# Patient Record
Sex: Female | Born: 1965 | Race: White | Hispanic: No | Marital: Married | Smoking: Former smoker
Health system: Southern US, Community
[De-identification: ages and names within clinical notes are randomized; demographics above are authoritative.]

## PROBLEM LIST (undated history)

## (undated) HISTORY — PX: TUBAL LIGATION: SHX77

---

## 2016-07-20 ENCOUNTER — Emergency Department (HOSPITAL_COMMUNITY): Payer: 59

## 2016-07-20 ENCOUNTER — Inpatient Hospital Stay (HOSPITAL_COMMUNITY)
Admission: EM | Admit: 2016-07-20 | Discharge: 2016-08-05 | DRG: 871 | Disposition: E | Payer: 59 | Attending: Pulmonary Disease | Admitting: Pulmonary Disease

## 2016-07-20 ENCOUNTER — Encounter (HOSPITAL_COMMUNITY): Payer: Self-pay | Admitting: Emergency Medicine

## 2016-07-20 DIAGNOSIS — R160 Hepatomegaly, not elsewhere classified: Secondary | ICD-10-CM | POA: Diagnosis present

## 2016-07-20 DIAGNOSIS — J80 Acute respiratory distress syndrome: Secondary | ICD-10-CM | POA: Diagnosis present

## 2016-07-20 DIAGNOSIS — R6510 Systemic inflammatory response syndrome (SIRS) of non-infectious origin without acute organ dysfunction: Secondary | ICD-10-CM | POA: Diagnosis present

## 2016-07-20 DIAGNOSIS — E875 Hyperkalemia: Secondary | ICD-10-CM | POA: Diagnosis present

## 2016-07-20 DIAGNOSIS — C3401 Malignant neoplasm of right main bronchus: Secondary | ICD-10-CM | POA: Diagnosis not present

## 2016-07-20 DIAGNOSIS — E860 Dehydration: Secondary | ICD-10-CM | POA: Diagnosis present

## 2016-07-20 DIAGNOSIS — Z515 Encounter for palliative care: Secondary | ICD-10-CM | POA: Diagnosis present

## 2016-07-20 DIAGNOSIS — R000 Tachycardia, unspecified: Secondary | ICD-10-CM

## 2016-07-20 DIAGNOSIS — K629 Disease of anus and rectum, unspecified: Secondary | ICD-10-CM | POA: Diagnosis present

## 2016-07-20 DIAGNOSIS — R6520 Severe sepsis without septic shock: Secondary | ICD-10-CM | POA: Diagnosis present

## 2016-07-20 DIAGNOSIS — R0603 Acute respiratory distress: Secondary | ICD-10-CM | POA: Diagnosis present

## 2016-07-20 DIAGNOSIS — E871 Hypo-osmolality and hyponatremia: Secondary | ICD-10-CM | POA: Diagnosis present

## 2016-07-20 DIAGNOSIS — Z801 Family history of malignant neoplasm of trachea, bronchus and lung: Secondary | ICD-10-CM

## 2016-07-20 DIAGNOSIS — J9601 Acute respiratory failure with hypoxia: Secondary | ICD-10-CM | POA: Diagnosis present

## 2016-07-20 DIAGNOSIS — G92 Toxic encephalopathy: Secondary | ICD-10-CM | POA: Diagnosis present

## 2016-07-20 DIAGNOSIS — J189 Pneumonia, unspecified organism: Secondary | ICD-10-CM | POA: Diagnosis present

## 2016-07-20 DIAGNOSIS — C787 Secondary malignant neoplasm of liver and intrahepatic bile duct: Secondary | ICD-10-CM | POA: Diagnosis present

## 2016-07-20 DIAGNOSIS — J440 Chronic obstructive pulmonary disease with acute lower respiratory infection: Secondary | ICD-10-CM | POA: Diagnosis present

## 2016-07-20 DIAGNOSIS — J9690 Respiratory failure, unspecified, unspecified whether with hypoxia or hypercapnia: Secondary | ICD-10-CM

## 2016-07-20 DIAGNOSIS — R590 Localized enlarged lymph nodes: Secondary | ICD-10-CM | POA: Diagnosis present

## 2016-07-20 DIAGNOSIS — E872 Acidosis: Secondary | ICD-10-CM | POA: Diagnosis present

## 2016-07-20 DIAGNOSIS — C3490 Malignant neoplasm of unspecified part of unspecified bronchus or lung: Secondary | ICD-10-CM | POA: Diagnosis not present

## 2016-07-20 DIAGNOSIS — I959 Hypotension, unspecified: Secondary | ICD-10-CM | POA: Diagnosis present

## 2016-07-20 DIAGNOSIS — Z01818 Encounter for other preprocedural examination: Secondary | ICD-10-CM

## 2016-07-20 DIAGNOSIS — C3480 Malignant neoplasm of overlapping sites of unspecified bronchus and lung: Secondary | ICD-10-CM | POA: Diagnosis not present

## 2016-07-20 DIAGNOSIS — F419 Anxiety disorder, unspecified: Secondary | ICD-10-CM | POA: Diagnosis present

## 2016-07-20 DIAGNOSIS — Z23 Encounter for immunization: Secondary | ICD-10-CM | POA: Diagnosis not present

## 2016-07-20 DIAGNOSIS — E876 Hypokalemia: Secondary | ICD-10-CM | POA: Diagnosis present

## 2016-07-20 DIAGNOSIS — Z803 Family history of malignant neoplasm of breast: Secondary | ICD-10-CM

## 2016-07-20 DIAGNOSIS — R0600 Dyspnea, unspecified: Secondary | ICD-10-CM | POA: Diagnosis not present

## 2016-07-20 DIAGNOSIS — F1010 Alcohol abuse, uncomplicated: Secondary | ICD-10-CM | POA: Diagnosis present

## 2016-07-20 DIAGNOSIS — D72825 Bandemia: Secondary | ICD-10-CM

## 2016-07-20 DIAGNOSIS — A419 Sepsis, unspecified organism: Principal | ICD-10-CM | POA: Diagnosis present

## 2016-07-20 DIAGNOSIS — Z87891 Personal history of nicotine dependence: Secondary | ICD-10-CM

## 2016-07-20 DIAGNOSIS — D649 Anemia, unspecified: Secondary | ICD-10-CM | POA: Diagnosis present

## 2016-07-20 DIAGNOSIS — C3400 Malignant neoplasm of unspecified main bronchus: Secondary | ICD-10-CM | POA: Diagnosis not present

## 2016-07-20 DIAGNOSIS — D72829 Elevated white blood cell count, unspecified: Secondary | ICD-10-CM | POA: Diagnosis present

## 2016-07-20 DIAGNOSIS — R579 Shock, unspecified: Secondary | ICD-10-CM | POA: Diagnosis present

## 2016-07-20 DIAGNOSIS — Z0189 Encounter for other specified special examinations: Secondary | ICD-10-CM

## 2016-07-20 LAB — COMPREHENSIVE METABOLIC PANEL
ALT: 19 U/L (ref 14–54)
ANION GAP: 10 (ref 5–15)
AST: 27 U/L (ref 15–41)
Albumin: 3.4 g/dL — ABNORMAL LOW (ref 3.5–5.0)
Alkaline Phosphatase: 73 U/L (ref 38–126)
BILIRUBIN TOTAL: 0.5 mg/dL (ref 0.3–1.2)
CHLORIDE: 101 mmol/L (ref 101–111)
CO2: 20 mmol/L — ABNORMAL LOW (ref 22–32)
Calcium: 9.5 mg/dL (ref 8.9–10.3)
Creatinine, Ser: 0.5 mg/dL (ref 0.44–1.00)
Glucose, Bld: 130 mg/dL — ABNORMAL HIGH (ref 65–99)
POTASSIUM: 3.5 mmol/L (ref 3.5–5.1)
Sodium: 131 mmol/L — ABNORMAL LOW (ref 135–145)
TOTAL PROTEIN: 6.8 g/dL (ref 6.5–8.1)

## 2016-07-20 LAB — CBC WITH DIFFERENTIAL/PLATELET
BASOS ABS: 0.1 10*3/uL (ref 0.0–0.1)
Basophils Relative: 0 %
EOS PCT: 4 %
Eosinophils Absolute: 0.6 10*3/uL (ref 0.0–0.7)
HEMATOCRIT: 40.3 % (ref 36.0–46.0)
Hemoglobin: 14 g/dL (ref 12.0–15.0)
LYMPHS PCT: 16 %
Lymphs Abs: 2.2 10*3/uL (ref 0.7–4.0)
MCH: 31 pg (ref 26.0–34.0)
MCHC: 34.7 g/dL (ref 30.0–36.0)
MCV: 89.2 fL (ref 78.0–100.0)
MONO ABS: 0.9 10*3/uL (ref 0.1–1.0)
MONOS PCT: 7 %
NEUTROS ABS: 9.9 10*3/uL — AB (ref 1.7–7.7)
Neutrophils Relative %: 73 %
PLATELETS: 476 10*3/uL — AB (ref 150–400)
RBC: 4.52 MIL/uL (ref 3.87–5.11)
RDW: 12.3 % (ref 11.5–15.5)
WBC: 13.6 10*3/uL — ABNORMAL HIGH (ref 4.0–10.5)

## 2016-07-20 LAB — TROPONIN I

## 2016-07-20 LAB — PROTIME-INR
INR: 1.04
Prothrombin Time: 13.6 seconds (ref 11.4–15.2)

## 2016-07-20 LAB — I-STAT ARTERIAL BLOOD GAS, ED
Acid-base deficit: 5 mmol/L — ABNORMAL HIGH (ref 0.0–2.0)
Bicarbonate: 17.7 mmol/L — ABNORMAL LOW (ref 20.0–28.0)
O2 SAT: 95 %
PCO2 ART: 25.9 mmHg — AB (ref 32.0–48.0)
PH ART: 7.442 (ref 7.350–7.450)
PO2 ART: 71 mmHg — AB (ref 83.0–108.0)
Patient temperature: 98.5
TCO2: 18 mmol/L (ref 0–100)

## 2016-07-20 LAB — URINALYSIS, ROUTINE W REFLEX MICROSCOPIC
BILIRUBIN URINE: NEGATIVE
Glucose, UA: NEGATIVE mg/dL
Hgb urine dipstick: NEGATIVE
KETONES UR: NEGATIVE mg/dL
LEUKOCYTES UA: NEGATIVE
NITRITE: NEGATIVE
PH: 6 (ref 5.0–8.0)
PROTEIN: NEGATIVE mg/dL
Specific Gravity, Urine: 1.002 — ABNORMAL LOW (ref 1.005–1.030)

## 2016-07-20 LAB — LACTIC ACID, PLASMA
LACTIC ACID, VENOUS: 2 mmol/L — AB (ref 0.5–1.9)
Lactic Acid, Venous: 1.2 mmol/L (ref 0.5–1.9)

## 2016-07-20 LAB — BRAIN NATRIURETIC PEPTIDE: B NATRIURETIC PEPTIDE 5: 33.1 pg/mL (ref 0.0–100.0)

## 2016-07-20 MED ORDER — SODIUM CHLORIDE 0.9 % IV BOLUS (SEPSIS)
1000.0000 mL | Freq: Once | INTRAVENOUS | Status: AC
  Administered 2016-07-20: 1000 mL via INTRAVENOUS

## 2016-07-20 MED ORDER — ONDANSETRON HCL 4 MG PO TABS: 4.0000 mg | ORAL_TABLET | Freq: Four times a day (QID) | ORAL | Status: DC | PRN

## 2016-07-20 MED ORDER — MORPHINE SULFATE (PF) 4 MG/ML IV SOLN
2.0000 mg | INTRAVENOUS | Status: DC | PRN
  Filled 2016-07-20: qty 1

## 2016-07-20 MED ORDER — MORPHINE SULFATE (PF) 2 MG/ML IV SOLN
2.0000 mg | INTRAVENOUS | Status: DC | PRN
  Administered 2016-07-21 (×4): 2 mg via INTRAVENOUS
  Filled 2016-07-20 (×4): qty 1

## 2016-07-20 MED ORDER — DILTIAZEM HCL 25 MG/5ML IV SOLN
10.0000 mg | Freq: Once | INTRAVENOUS | Status: AC
Start: 2016-07-20 — End: 2016-07-20
  Administered 2016-07-20: 10 mg via INTRAVENOUS
  Filled 2016-07-20: qty 5

## 2016-07-20 MED ORDER — ENOXAPARIN SODIUM 40 MG/0.4ML ~~LOC~~ SOLN
40.0000 mg | SUBCUTANEOUS | Status: DC
  Administered 2016-07-21 – 2016-07-22 (×2): 40 mg via SUBCUTANEOUS
  Filled 2016-07-20 (×2): qty 0.4

## 2016-07-20 MED ORDER — SODIUM CHLORIDE 0.9 % IV SOLN
INTRAVENOUS | Status: AC
  Administered 2016-07-20: 23:00:00 via INTRAVENOUS

## 2016-07-20 MED ORDER — ACETAMINOPHEN 650 MG RE SUPP
650.0000 mg | Freq: Four times a day (QID) | RECTAL | Status: DC | PRN
  Administered 2016-07-21 – 2016-07-22 (×3): 650 mg via RECTAL
  Filled 2016-07-20 (×3): qty 1

## 2016-07-20 MED ORDER — IPRATROPIUM-ALBUTEROL 0.5-2.5 (3) MG/3ML IN SOLN
3.0000 mL | RESPIRATORY_TRACT | Status: AC
  Administered 2016-07-20: 3 mL via RESPIRATORY_TRACT
  Filled 2016-07-20: qty 3

## 2016-07-20 MED ORDER — PIPERACILLIN-TAZOBACTAM 3.375 G IVPB 30 MIN
3.3750 g | Freq: Three times a day (TID) | INTRAVENOUS | Status: DC
  Administered 2016-07-20 – 2016-07-25 (×14): 3.375 g via INTRAVENOUS
  Filled 2016-07-20 (×19): qty 50

## 2016-07-20 MED ORDER — LEVALBUTEROL HCL 0.63 MG/3ML IN NEBU: 1.8900 mg | INHALATION_SOLUTION | Freq: Once | RESPIRATORY_TRACT | Status: DC

## 2016-07-20 MED ORDER — FENTANYL CITRATE (PF) 100 MCG/2ML IJ SOLN
50.0000 ug | Freq: Once | INTRAMUSCULAR | Status: AC
  Administered 2016-07-20: 50 ug via INTRAVENOUS
  Filled 2016-07-20: qty 2

## 2016-07-20 MED ORDER — LACTATED RINGERS IV BOLUS (SEPSIS)
1000.0000 mL | Freq: Once | INTRAVENOUS | Status: AC
  Administered 2016-07-20: 1000 mL via INTRAVENOUS

## 2016-07-20 MED ORDER — MORPHINE SULFATE (PF) 4 MG/ML IV SOLN
2.0000 mg | Freq: Once | INTRAVENOUS | Status: AC
  Administered 2016-07-20: 2 mg via INTRAVENOUS

## 2016-07-20 MED ORDER — IPRATROPIUM-ALBUTEROL 0.5-2.5 (3) MG/3ML IN SOLN
3.0000 mL | RESPIRATORY_TRACT | Status: DC
  Administered 2016-07-20 – 2016-07-23 (×16): 3 mL via RESPIRATORY_TRACT
  Filled 2016-07-20 (×16): qty 3

## 2016-07-20 MED ORDER — IPRATROPIUM BROMIDE 0.02 % IN SOLN
1.0000 mg | Freq: Once | RESPIRATORY_TRACT | Status: DC
  Filled 2016-07-20: qty 5

## 2016-07-20 MED ORDER — ONDANSETRON HCL 4 MG/2ML IJ SOLN: 4.0000 mg | Freq: Four times a day (QID) | INTRAMUSCULAR | Status: DC | PRN

## 2016-07-20 MED ORDER — IPRATROPIUM BROMIDE 0.02 % IN SOLN
1.0000 mg | Freq: Once | RESPIRATORY_TRACT | Status: AC
  Administered 2016-07-20: 1 mg via RESPIRATORY_TRACT
  Filled 2016-07-20: qty 5

## 2016-07-20 MED ORDER — IOPAMIDOL (ISOVUE-370) INJECTION 76%
INTRAVENOUS | Status: AC
  Administered 2016-07-20: 100 mL
  Filled 2016-07-20: qty 100

## 2016-07-20 MED ORDER — HYDROCODONE-ACETAMINOPHEN 5-325 MG PO TABS: 1.0000 | ORAL_TABLET | ORAL | Status: DC | PRN

## 2016-07-20 MED ORDER — SODIUM CHLORIDE 0.9 % IV SOLN
500.0000 mg | Freq: Two times a day (BID) | INTRAVENOUS | Status: DC
  Administered 2016-07-20 – 2016-07-21 (×2): 500 mg via INTRAVENOUS
  Filled 2016-07-20 (×3): qty 500

## 2016-07-20 MED ORDER — ACETAMINOPHEN 325 MG PO TABS
650.0000 mg | ORAL_TABLET | Freq: Four times a day (QID) | ORAL | Status: DC | PRN
  Administered 2016-07-25: 650 mg via ORAL
  Filled 2016-07-20: qty 2

## 2016-07-20 MED ORDER — ALBUTEROL (5 MG/ML) CONTINUOUS INHALATION SOLN
15.0000 mg | INHALATION_SOLUTION | RESPIRATORY_TRACT | Status: DC
  Administered 2016-07-20: 15 mg via RESPIRATORY_TRACT
  Filled 2016-07-20: qty 20

## 2016-07-20 MED ORDER — LEVALBUTEROL HCL 0.63 MG/3ML IN NEBU: 0.6300 mg | INHALATION_SOLUTION | RESPIRATORY_TRACT | Status: DC | PRN

## 2016-07-20 MED ORDER — SODIUM CHLORIDE 0.9% FLUSH
3.0000 mL | Freq: Two times a day (BID) | INTRAVENOUS | Status: DC
  Administered 2016-07-20 – 2016-07-25 (×7): 3 mL via INTRAVENOUS

## 2016-07-20 NOTE — ED Notes (Signed)
Charge RN aware of need for room 

## 2016-07-20 NOTE — ED Notes (Signed)
hhn finished  Pt has frequent urination

## 2016-07-20 NOTE — ED Notes (Signed)
Pt would like to establish a physician here in [place]- at [facility].

## 2016-07-20 NOTE — ED Notes (Signed)
Pt was dx with lung ca Tuesday -- seeing a dr in [place], pt has been short of breath for 1-2 weeks, pt unable to speak in complete sentences, O2 sats on room air-- 80's on RA- pt placed on O2 3 L/M/[place]-- sats 90-92%.

## 2016-07-20 NOTE — H&P (Signed)
Felicia Long XTA:[id number] DOB: 1965-08-19 DOA: 07/13/2016     PCP: Elliot Dally, MD  [facility] Specialists: [facility] Patient coming from:    home Lives   With family    Chief Complaint: Shortness of breath and associated chest pain  HPI: Felicia Long is a 51 y.o. female with medical history significant of a diagnosed lung mass and rectal mass with likely metastatic widespread    Presented with worsening dyspnea and shortness of breath and associated chest pain worsen her baseline. She can hardly walk without severe dyspnea which improved with rest. No leg swelling or calf pain.  Patient was found to have new diagnosis of diffuse lung masses as well as masses to her colon and liver few weeks ago. She was seen on 26 over every by her PCP with 2 weeks of cough and worsening shortness of breath she had denied any fevers or chills. D-dimer was elevated CT angiogram was done showing multiple lung masses and she was seen by pulmonology on March 2nd was told that CT scan was worrisome for malignancy due to metastases ordered stat PET scan Patient quit smoking only recently. 15 pound weight loss over past 6 weeks unintentional reports mild hematochezia and constipation no hemoptysis. Denies fevers or chills Denies any prior malignancy history area reports family history of breast cancer never had a mammogram or colonoscopy.  Patient used to drink heavily but all now will have cut down and drinks socially only.  Regarding pertinent Chronic problems: initial CT showed The CTPA showed no evidence of pulmonary emboli but did show multiple scattered bilateral pulmonary nodules many with central cavitation concerning for metastatic disease. In the right medial upper lobe and hilar region was a masslike area of consolidation measuring 3.2 x 7.2 cm. Were also peripherally enhancing masses within the liver the largest in the left hepatic lobe measuring 7 x 9 cm  PET  scan that was done on March 13 showed IMPRESSION: 1.Hypermetabolic right upper lobe masslike soft tissue, most likely primary lung malignancy.  2.Extensive metastatic disease including innumerable pulmonary metastases, hepatic metastases, and adenopathy throughout the chest, abdomen, and pelvis. Small peritoneal implant anterior to the left sacrum is noted.  3.Hypermetabolic rectal soft tissue with perirectal lymph nodes could represent a primary rectal malignancy.  4.Nonspecific mild focal uptake in the left oropharynx. Direct visualization may be beneficial.  Result Narrative  PROCEDURE: F-18 FDG PET/CTscan from the skull base to the mid thighs.  INDICATION: Female, 51 years old.lung mass/multiple nodules/liver lesions Initial treatment strategy.  TECHNIQUE: PET/CTimaging was performed from the skull base to the mid thighs using routine PET acquisition following evaluation of serum glucose level and intravenous administration of F-18 FDG, per standard protocol. A CT scan was performed for  localization and attenuation correction purposes only and is not intended for diagnosis separate from the PET scan.   Radiopharmaceutical: 7.14 mCi of F-18 FDG, intravenously. Blood Glucose level prior to FDG injection: 102 mg/dL. Time from injection to imaging: 61 minutes.  COMPARISON: Chest CTA 07/03/2016  FINDINGS:  Head/Neck:  There is some asymmetric hypermetabolic soft tissue along the left posterior lateral oropharynx (SUV max 4.6, image 38). No hypermetabolic cervical adenopathy.  Chest:  Hypermetabolic masslike soft tissue in the anterior medial right upper lobe with SUV max of 7.5. Innumerable hypermetabolic bilateral pulmonary nodules, many of which are cavitary. Hypermetabolic mediastinal and hilar adenopathy.  Abdomen / Pelvis:  Multiple hypermetabolic liver masses, the largest of which measures 9.1  x 7.5 cm (image 133) and demonstrates some central necrosis. The SUV  max is 8.7 along the periphery. Hypermetabolic retroperitoneal and pelvic lymph nodes. There is a hypermetabolic  1.1 cm nodule between the left sacrum and the psoas muscle (SUV max 3.2, image 196). There is increased activity within the rectum relative to the remainder of the bowel with SUV max of 18. Small hypermetabolic perirectal lymph nodes.  Musculoskeletal:  No aggressive lesions. No abnormal osseous FDG activity.     IN ER:  Temp (24hrs), Avg:98.5 F (36.9 C), Min:98.5 F (36.9 C), Max:98.5 F (36.9 C)      RR 34 pulse 110 BP 124/80  WBC 13.6 Hg 14NA 131 biacrb 20 INR 1.04  CT Angio: no PE prior tumors notes tumor encasement of multiple right upper lobe pulmonary arterial branches.  Following Medications were ordered in ER: Medications  ipratropium-albuterol (DUONEB) 0.5-2.5 (3) MG/3ML nebulizer solution 3 mL (3 mLs Nebulization Given 07/24/2016 1656)  sodium chloride 0.9 % bolus 1,000 mL (1,000 mLs Intravenous New Bag/Given 07/13/2016 1552)  iopamidol (ISOVUE-370) 76 % injection (100 mLs  Contrast Given 07/09/2016 1731)     Hospitalist was called for admission for New proxy in a setting of diffuse pulmonary mass is  Review of Systems:    Pertinent positives include: chest pain, shortness of breath  Constitutional:  No weight loss, night sweats, Fevers, chills, fatigue, weight loss  HEENT:  No headaches, Difficulty swallowing,Tooth/dental problems,Sore throat,  No sneezing, itching, ear ache, nasal congestion, post nasal drip,  Cardio-vascular:  No Orthopnea, PND, anasarca, dizziness, palpitations.no Bilateral lower extremity swelling  GI:  No heartburn, indigestion, abdominal pain, nausea, vomiting, diarrhea, change in bowel habits, loss of appetite, melena, blood in stool, hematemesis Resp:  no shortness of breath at rest. No dyspnea on exertion, No excess mucus, no productive cough, No non-productive cough, No coughing up of blood.No change in color of mucus.No  wheezing. Skin:  no rash or lesions. No jaundice GU:  no dysuria, change in color of urine, no urgency or frequency. No straining to urinate.  No flank pain.  Musculoskeletal:  No joint pain or no joint swelling. No decreased range of motion. No back pain.  Psych:  No change in mood or affect. No depression or anxiety. No memory loss.  Neuro: no localizing neurological complaints, no tingling, no weakness, no double vision, no gait abnormality, no slurred speech, no confusion  As per HPI otherwise 10 point review of systems negative.   Past Medical History: History reviewed. No pertinent past medical history. Past Surgical History:  Procedure Laterality Date  . TUBAL LIGATION       Social History:  Ambulatory   Independently     reports that she quit smoking 11 days ago. Her smoking use included Cigarettes. She has never used smokeless tobacco. She reports that she does not drink alcohol or use drugs.  Allergies:  No Known Allergies     Family History:   Family History  Problem Relation Age of Onset  . Breast cancer Mother   . Lung cancer Other   . CAD Other     Medications: Prior to Admission medications   Medication Sig Start Date End Date Taking? Authorizing Provider  acetaminophen (TYLENOL) 500 MG tablet Take 1,000 mg by mouth every 6 (six) hours as needed for mild pain.   Yes Historical Provider, MD  ibuprofen (ADVIL,MOTRIN) 200 MG tablet Take 800 mg by mouth every 6 (six) hours as needed for mild pain.  Yes Historical Provider, MD  temazepam (RESTORIL) 30 MG capsule Take 30 mg by mouth at bedtime as needed for sleep. 07/11/16  Yes Historical Provider, MD    Physical Exam: Patient Vitals for the past 24 hrs:  BP Temp Temp src Pulse Resp SpO2  07/19/2016 1815 124/80 - - (!) 110 (!) 34 91 %  07/30/2016 1715 115/79 - - (!) 105 (!) 30 90 %  07/09/2016 1700 114/76 - - (!) 104 (!) 30 96 %  07/14/2016 1615 119/78 - - 100 (!) 27 92 %  07/13/2016 1545 103/78 - - (!) 115 (!)  33 91 %  07/27/2016 1530 110/80 - - (!) 124 (!) 27 92 %  07/27/2016 1500 131/87 - - (!) 124 (!) 31 91 %  07/19/2016 1445 113/83 - - (!) 119 (!) 27 (!) 89 %  07/22/2016 1432 - - - - - 91 %  07/19/2016 1415 112/77 - - (!) 119 (!) 27 90 %  07/06/2016 1355 114/78 98.5 F (36.9 C) Axillary (!) 131 (!) 39 (!) 89 %    1. General:  in No Acute distress, increased work of breathing 2. Psychological: Alert and   Oriented 3. Head/ENT:    Dry Mucous Membranes                          Head Non traumatic, neck supple                          Normal   Dentition 4. SKIN:   decreased Skin turgor,  Skin clean Dry and intact no rash 5. Heart: Regular rate and rhythm no  Murmur, Rub or gallop 6. Lungs: some wheezes and crackles   7. Abdomen: Soft,  non-tender, Non distended 8. Lower extremities: no clubbing, cyanosis, or edema 9. Neurologically Grossly intact, moving all 4 extremities equally   10. MSK: Normal range of motion   body mass index is unknown because there is no height or weight on file.  Labs on Admission:   Labs on Admission: I have personally reviewed following labs and imaging studies  CBC:  Recent Labs Lab 07/23/2016 1354  WBC 13.6*  NEUTROABS 9.9*  HGB 14.0  HCT 40.3  MCV 89.2  PLT 185*   Basic Metabolic Panel:  Recent Labs Lab 07/27/2016 1354  NA 131*  K 3.5  CL 101  CO2 20*  GLUCOSE 130*  BUN <5*  CREATININE 0.50  CALCIUM 9.5   GFR: CrCl cannot be calculated (Unknown ideal weight.). Liver Function Tests:  Recent Labs Lab 07/05/2016 1354  AST 27  ALT 19  ALKPHOS 73  BILITOT 0.5  PROT 6.8  ALBUMIN 3.4*   No results for input(s): LIPASE, AMYLASE in the last 168 hours. No results for input(s): AMMONIA in the last 168 hours. Coagulation Profile:  Recent Labs Lab 07/06/2016 1543  INR 1.04   Cardiac Enzymes:  Recent Labs Lab 07/22/2016 1543  TROPONINI <0.03   BNP (last 3 results) No results for input(s): PROBNP in the last 8760 hours. HbA1C: No results for  input(s): HGBA1C in the last 72 hours. CBG: No results for input(s): GLUCAP in the last 168 hours. Lipid Profile: No results for input(s): CHOL, HDL, LDLCALC, TRIG, CHOLHDL, LDLDIRECT in the last 72 hours. Thyroid Function Tests: No results for input(s): TSH, T4TOTAL, FREET4, T3FREE, THYROIDAB in the last 72 hours. Anemia Panel: No results for input(s): VITAMINB12, FOLATE, FERRITIN, TIBC, IRON,  RETICCTPCT in the last 72 hours. Urine analysis:    Component Value Date/Time   COLORURINE STRAW (A) 08/02/2016 1549   APPEARANCEUR CLEAR 07/28/2016 1549   LABSPEC 1.002 (L) 08/01/2016 1549   PHURINE 6.0 07/28/2016 1549   GLUCOSEU NEGATIVE 07/08/2016 1549   HGBUR NEGATIVE 07/19/2016 1549   BILIRUBINUR NEGATIVE 07/19/2016 1549   KETONESUR NEGATIVE 07/21/2016 1549   PROTEINUR NEGATIVE 07/23/2016 1549   NITRITE NEGATIVE 07/14/2016 1549   LEUKOCYTESUR NEGATIVE 07/08/2016 1549   Sepsis Labs: '@LABRCNTIP'$ (procalcitonin:4,lacticidven:4) )No results found for this or any previous visit (from the past 240 hour(s)).    UA  no evidence of UTI     No results found for: HGBA1C  CrCl cannot be calculated (Unknown ideal weight.).  BNP (last 3 results) No results for input(s): PROBNP in the last 8760 hours.   ECG REPORT  Independently reviewed Rate:134  Rhythm: Sinus tachycardia ST&T Change: No acute ischemic changes   QTC 421  There were no vitals filed for this visit.   Cultures: No results found for: SDES, SPECREQUEST, CULT, REPTSTATUS   Radiological Exams on Admission: Dg Chest 2 View  Result Date: 07/21/2016 CLINICAL DATA:  Severe shortness of breath. Nonproductive cough. Recent PET scan that demonstrated probable primary malignancy of the lung as well as pulmonary and hepatic metastases as well as extensive adenopathy in the chest, abdomen, and pelvis. EXAM: CHEST  2 VIEW COMPARISON:  Report of PET-CT scan dated 07/17/2016 FINDINGS: The patient has a 4.3 cm right hilar mass as well as  innumerable metastatic nodules in both lungs. There are no consolidative infiltrates or effusions. The heart size is normal. Bones appear normal. IMPRESSION: Right perihilar lung mass. Innumerable metastatic lesions throughout both lungs. Electronically Signed   By: Lorriane Shire M.D.   On: 07/17/2016 16:37   Ct Angio Chest Pe W And/or Wo Contrast  Result Date: 07/15/2016 CLINICAL DATA:  Shortness of breath with dry cough for 2 months EXAM: CT ANGIOGRAPHY CHEST WITH CONTRAST TECHNIQUE: Multidetector CT imaging of the chest was performed using the standard protocol during bolus administration of intravenous contrast. Multiplanar CT image reconstructions and MIPs were obtained to evaluate the vascular anatomy. CONTRAST:  100 cc Isovue 370 intravenous COMPARISON:  Chest x-ray 07/26/2016 FINDINGS: Cardiovascular: Satisfactory opacification of the pulmonary arteries to the segmental level. No evidence of pulmonary embolism. Encasement and significant narrowing of multiple right upper lobe pulmonary artery branch vessels. Non aneurysmal aorta. No dissection. Normal heart size. No pericardial effusion. Mediastinum/Nodes: Extensive mediastinal adenopathy. Nodal mass anterior to the trachea measures 1.8 x 2 cm. Matted adenopathy within the AP window and sub- carinal space. Subcarinal soft tissue mass measures 2.6 cm. Multiple enlarged bilateral hilar nodes. Suspected right hilar which is contiguous with consolidation an or mass in the anterior right upper lobe, this measures 5.6 cm AP by 6.7 cm oblique axial. Trachea midline. Diffuse peribronchial soft tissue mass an or thickening. Esophagus is nonenlarged. Lungs/Pleura: Innumerable pulmonary masses and nodules. No pleural effusion. Focal consolidative process an or tumor in the right upper lobe contiguous with right hilar soft tissue mass. Upper Abdomen: Multiple ill-defined masses within the liver, the largest is seen within the left hepatic lobe and measures at least  7.1 cm. Imaged adrenal glands, spleen, and upper kidneys are within normal limits. Imaged pancreas normal. Musculoskeletal: No acute or suspicious bone lesion Review of the MIP images confirms the above findings. IMPRESSION: 1. No definite acute pulmonary embolus or aortic dissection 2. Right hilar mass which is  contiguous with consolidation and or mass in the anterior portion of the right upper lobe. There is tumor encasement of multiple right upper lobe pulmonary arterial branches. 3. Extensive mediastinal and hilar adenopathy, consistent with metastatic disease. Diffuse bilateral peribronchial soft tissue thickening. 4. Innumerable pulmonary nodules and masses, consistent with metastatic disease. 5. Multiple ill-defined masses within the liver, consistent with hepatic metastatic disease. Electronically Signed   By: Donavan Foil M.D.   On: 07/16/2016 18:16    Chart has been reviewed    Assessment/Plan   51 y.o. female with medical history significant of a diagnosed lung mass and rectal mass with likely metastatic widespread   Present on Admission: . Lung cancer ([place]) - discussed with oncology who seen in consult overall suspect poor prognosis. Obtain CA 125 CA 19, CEA, oncology will discuss possible need for biopsy vs comfort measures, Will likely need further staging with MRI if respiratory status improves . Acute respiratory failure with hypoxia ([facility]) most likely secondary to severe lung disease will admit to step down attempt to use nebulizer obtain ABG gas 7.441/26/71 Discussed with [facility] appreciate their consult for increased work of breathing . Leukocytosis possibly stressed margination afebrile. SIRS  - tachycardia to keep make hypoxic possibility of postobstructive pneumonia coverage with Zosyn and obtain serial lactic acid     Other plan as per orders.  DVT prophylaxis:  Lovenox   Code Status:  FULL CODE  as per patient    Family Communication:   Family  at  Bedside  plan of  care was discussed with Husband,   Disposition Plan:  unfortunately very poor prognosis will benefit from palliative care goals of care discussion                        Would benefit from PT/OT eval prior to DC   Order once stable                        Palliative care   consulted                          Consults called: notify oncology   Admission status:    inpatient     Level of care     SDU      I have spent a total of 77 min on this admission  extra time was spent to discuss case with PCCM and oncology   [place] 07/17/2016, 10:34 PM    [facility]  Pager [phone]   after 2 AM please page floor coverage PA If 7AM-7PM, please contact the day team taking care of the patient  [email or web address]  Password [email or web address]

## 2016-07-20 NOTE — ED Triage Notes (Signed)
Pt in c/o SOB, pt had PET scan with recent Lung Cancer diagnosis, pt has fast HR, tachypnea, & sats in the upper 80s in triage, pt speaks in short sentences, using accessory  Muscles, A&O x4

## 2016-07-20 NOTE — ED Notes (Signed)
Pt still c/o lower back pain

## 2016-07-20 NOTE — ED Notes (Signed)
To x-ray

## 2016-07-20 NOTE — ED Notes (Signed)
Report called to rn on 4e 

## 2016-07-20 NOTE — Progress Notes (Signed)
Pt is on NIV at this time tolerating it well. Settings are in the flowsheet. Family at bedside

## 2016-07-20 NOTE — ED Notes (Signed)
The pt returned from c-t  Still having pain in her back asking for water given.  edp has talked with her and is admitting her  Iv fluid approx half infused

## 2016-07-20 NOTE — ED Notes (Signed)
Pt c/o lower back pain and a little lower abd pain

## 2016-07-20 NOTE — ED Provider Notes (Signed)
[facility] DEPT Provider Note   CSN: [id number] Arrival date & time: 07/19/2016  1346     History   Chief Complaint Chief Complaint  Patient presents with  . Chest Pain    HPI Felicia Long is a 51 y.o. female.  HPI  This is a 51 year old female with a long smoking history and alcohol use history but not addicted who recently was diagnosed with likely metastatic lung cancer possibly a primary lesion in the rectum as well. She was seen by no vomiting for these things and had a PET scan done a couple days ago which showed multiple lesions in liver long rectum and soft tissue. The last few weeks she's had progressively worsening dyspnea worse with exertion. She states her last with agents even gotten worse. She states that she can't walk very far without incident for short of breath when she stops he gets better. She doesn't really have any other associated symptoms. She has not had fever, cough, chest pain, calf pain, leg swelling. She has had recent weight loss but since the cancer diagnoses increased her intake. She still does so she slightly dehydrated with dry mouth but has been urinating relatively clear. Nitrite negative for the symptoms. Nothing makes it better such rest and even then she feels short of breath and like she is breathing fast.  History reviewed. No pertinent past medical history.  Patient Active Problem List   Diagnosis Date Noted  . Lung cancer ([place]) 08/02/2016  . Acute respiratory failure with hypoxia ([place]) 07/29/2016  . Leukocytosis 07/14/2016  . Respiratory distress 07/12/2016    Past Surgical History:  Procedure Laterality Date  . TUBAL LIGATION      OB History    No data available       Home Medications    Prior to Admission medications   Medication Sig Start Date End Date Taking? Authorizing Provider  acetaminophen (TYLENOL) 500 MG tablet Take 1,000 mg by mouth every 6 (six) hours as needed for mild pain.   Yes Historical Provider, MD    ibuprofen (ADVIL,MOTRIN) 200 MG tablet Take 800 mg by mouth every 6 (six) hours as needed for mild pain.   Yes Historical Provider, MD  temazepam (RESTORIL) 30 MG capsule Take 30 mg by mouth at bedtime as needed for sleep. 07/11/16  Yes Historical Provider, MD    Family History Family History  Problem Relation Age of Onset  . Breast cancer Mother   . Lung cancer Other   . CAD Other     Social History Social History  Substance Use Topics  . Smoking status: Former Smoker    Types: Cigarettes    Quit date: 07/09/2016  . Smokeless tobacco: Never Used  . Alcohol use No     Comment: quit 1 month     Allergies   Patient has no known allergies.   Review of Systems Review of Systems  All other systems reviewed and are negative.    Physical Exam Updated Vital Signs BP 130/90   Pulse 94   Temp 98.5 F (36.9 C) (Axillary)   Resp (!) 37   LMP 07/06/2016 (Approximate)   SpO2 (!) 89%   Physical Exam  Constitutional: She is oriented to person, place, and time. She appears well-developed and well-nourished.  HENT:  Head: Normocephalic and atraumatic.  Eyes: Conjunctivae and EOM are normal.  Neck: Normal range of motion.  Cardiovascular: Regular rhythm.  Tachycardia present.   Pulmonary/Chest: No stridor. Tachypnea noted. She is in respiratory  distress (mild). She has decreased breath sounds. She has wheezes. She has rales.  rquiring 4L to maintain sats  Abdominal: Soft. Bowel sounds are normal. She exhibits no distension. There is no tenderness. There is no guarding.  Neurological: She is alert and oriented to person, place, and time.  Skin: Skin is warm and dry.  Nursing note and vitals reviewed.    ED Treatments / Results  Labs (all labs ordered are listed, but only abnormal results are displayed) Labs Reviewed  CBC WITH DIFFERENTIAL/PLATELET - Abnormal; Notable for the following:       Result Value   WBC 13.6 (*)    Platelets 476 (*)    Neutro Abs 9.9 (*)    All  other components within normal limits  COMPREHENSIVE METABOLIC PANEL - Abnormal; Notable for the following:    Sodium 131 (*)    CO2 20 (*)    Glucose, Bld 130 (*)    BUN <5 (*)    Albumin 3.4 (*)    All other components within normal limits  URINALYSIS, ROUTINE W REFLEX MICROSCOPIC - Abnormal; Notable for the following:    Color, Urine STRAW (*)    Specific Gravity, Urine 1.002 (*)    All other components within normal limits  TROPONIN I  BRAIN NATRIURETIC PEPTIDE  LACTIC ACID, PLASMA  PROTIME-INR  LACTIC ACID, PLASMA    EKG  EKG Interpretation  Date/Time:  Friday July 20 2016 13:51:39 EDT Ventricular Rate:  134 PR Interval:  142 QRS Duration: 62 QT Interval:  282 QTC Calculation: 421 R Axis:   114 Text Interpretation:  Sinus tachycardia Right atrial enlargement Right axis deviation Anteroseptal infarct , age undetermined Abnormal ECG Confirmed by [facility] MD, Braian Tijerina [phone]) on 07/17/2016 3:07:46 PM       Radiology Dg Chest 2 View  Result Date: 07/17/2016 CLINICAL DATA:  Severe shortness of breath. Nonproductive cough. Recent PET scan that demonstrated probable primary malignancy of the lung as well as pulmonary and hepatic metastases as well as extensive adenopathy in the chest, abdomen, and pelvis. EXAM: CHEST  2 VIEW COMPARISON:  Report of PET-CT scan dated 07/17/2016 FINDINGS: The patient has a 4.3 cm right hilar mass as well as innumerable metastatic nodules in both lungs. There are no consolidative infiltrates or effusions. The heart size is normal. Bones appear normal. IMPRESSION: Right perihilar lung mass. Innumerable metastatic lesions throughout both lungs. Electronically Signed   By: Lorriane Shire M.D.   On: 08/01/2016 16:37   Ct Angio Chest Pe W And/or Wo Contrast  Result Date: 07/08/2016 CLINICAL DATA:  Shortness of breath with dry cough for 2 months EXAM: CT ANGIOGRAPHY CHEST WITH CONTRAST TECHNIQUE: Multidetector CT imaging of the chest was performed using the  standard protocol during bolus administration of intravenous contrast. Multiplanar CT image reconstructions and MIPs were obtained to evaluate the vascular anatomy. CONTRAST:  100 cc Isovue 370 intravenous COMPARISON:  Chest x-ray 07/18/2016 FINDINGS: Cardiovascular: Satisfactory opacification of the pulmonary arteries to the segmental level. No evidence of pulmonary embolism. Encasement and significant narrowing of multiple right upper lobe pulmonary artery branch vessels. Non aneurysmal aorta. No dissection. Normal heart size. No pericardial effusion. Mediastinum/Nodes: Extensive mediastinal adenopathy. Nodal mass anterior to the trachea measures 1.8 x 2 cm. Matted adenopathy within the AP window and sub- carinal space. Subcarinal soft tissue mass measures 2.6 cm. Multiple enlarged bilateral hilar nodes. Suspected right hilar which is contiguous with consolidation an or mass in the anterior right upper lobe, this measures  5.6 cm AP by 6.7 cm oblique axial. Trachea midline. Diffuse peribronchial soft tissue mass an or thickening. Esophagus is nonenlarged. Lungs/Pleura: Innumerable pulmonary masses and nodules. No pleural effusion. Focal consolidative process an or tumor in the right upper lobe contiguous with right hilar soft tissue mass. Upper Abdomen: Multiple ill-defined masses within the liver, the largest is seen within the left hepatic lobe and measures at least 7.1 cm. Imaged adrenal glands, spleen, and upper kidneys are within normal limits. Imaged pancreas normal. Musculoskeletal: No acute or suspicious bone lesion Review of the MIP images confirms the above findings. IMPRESSION: 1. No definite acute pulmonary embolus or aortic dissection 2. Right hilar mass which is contiguous with consolidation and or mass in the anterior portion of the right upper lobe. There is tumor encasement of multiple right upper lobe pulmonary arterial branches. 3. Extensive mediastinal and hilar adenopathy, consistent with  metastatic disease. Diffuse bilateral peribronchial soft tissue thickening. 4. Innumerable pulmonary nodules and masses, consistent with metastatic disease. 5. Multiple ill-defined masses within the liver, consistent with hepatic metastatic disease. Electronically Signed   By: Donavan Foil M.D.   On: 07/16/2016 18:16    Procedures Procedures (including critical care time)  CRITICAL CARE Performed by: Merrily Pew Total critical care time: 45 minutes Critical care time was exclusive of separately billable procedures and treating other patients. Critical care was necessary to treat or prevent imminent or life-threatening deterioration. Critical care was time spent personally by me on the following activities: development of treatment plan with patient and/or surrogate as well as nursing, discussions with consultants, evaluation of patient's response to treatment, examination of patient, obtaining history from patient or surrogate, ordering and performing treatments and interventions, ordering and review of laboratory studies, ordering and review of radiographic studies, pulse oximetry and re-evaluation of patient's condition.  Medications Ordered in ED Medications  ipratropium-albuterol (DUONEB) 0.5-2.5 (3) MG/3ML nebulizer solution 3 mL (3 mLs Nebulization Given 08/01/2016 1656)  ipratropium-albuterol (DUONEB) 0.5-2.5 (3) MG/3ML nebulizer solution 3 mL (3 mLs Nebulization Given 07/29/2016 1959)  levalbuterol (XOPENEX) nebulizer solution 0.63 mg (not administered)  morphine 4 MG/ML injection 2 mg (not administered)  sodium chloride 0.9 % bolus 1,000 mL (0 mLs Intravenous Stopped 07/06/2016 1948)  iopamidol (ISOVUE-370) 76 % injection (100 mLs  Contrast Given 07/12/2016 1731)  sodium chloride 0.9 % bolus 1,000 mL (1,000 mLs Intravenous New Bag/Given 07/09/2016 2000)  fentaNYL (SUBLIMAZE) injection 50 mcg (50 mcg Intravenous Given 07/07/2016 2015)     Initial Impression / Assessment and Plan / ED Course  I  have reviewed the triage vital signs and the nursing notes.  Pertinent labs & imaging results that were available during my care of the patient were reviewed by me and considered in my medical decision making (see chart for details).     On exam, requiring 4L to maintain sats > 90, tachypnea, tachycardia. Suspect pleural effusion, however is high risk for PE as well.  Less likely asthma/copd/emphysema, but will give breathing tx to see if it helps.  Breathing treatments seem to help. Chest x-ray showed diffuse metastatic disease. CT scan did not show PE but just revealed the same of multiple areas of masses and inflammation. HR improved with fluids consistent with dehydration. Palliative care consult orderedis secondary to the extent of her disease and her new respiratory failure and a likely poor prognosis of this. Discussed the case with medicine who will admit for further workup and management.  Final Clinical Impressions(s) / ED Diagnoses   Final  diagnoses:  Acute respiratory failure with hypoxia ([place])  Tachycardia  Dehydration      Merrily Pew, MD 07/18/2016 2049

## 2016-07-20 NOTE — ED Notes (Signed)
To ct

## 2016-07-20 NOTE — Progress Notes (Signed)
Pharmacy Antibiotic Note  Felicia Long is a 51 y.o. female admitted on 07/28/2016 with sepsis.  Pharmacy has been consulted for Vancomycin dosing. Recent diagnosis of metastatic lung CA. WBC mildly elevated, renal function good.   Plan: Vancomycin 500 mg IV q12h Zosyn 3.375G IV q8h to be infused over 4 hours Trend WBC, temp, renal function  F/U infectious work-up Drug levels as indicated  Temp (24hrs), Avg:98.5 F (36.9 C), Min:98.5 F (36.9 C), Max:98.5 F (36.9 C)   Recent Labs Lab 07/07/2016 1354 07/16/2016 1543 07/17/2016 1817  WBC 13.6*  --   --   CREATININE 0.50  --   --   LATICACIDVEN  --  1.2 2.0*    CrCl cannot be calculated (Unknown ideal weight.).    No Known Allergies  Narda Bonds 07/08/2016 10:43 PM

## 2016-07-20 NOTE — Progress Notes (Signed)
ABG collected  

## 2016-07-21 DIAGNOSIS — Z515 Encounter for palliative care: Secondary | ICD-10-CM

## 2016-07-21 DIAGNOSIS — C3490 Malignant neoplasm of unspecified part of unspecified bronchus or lung: Secondary | ICD-10-CM

## 2016-07-21 DIAGNOSIS — R0600 Dyspnea, unspecified: Secondary | ICD-10-CM

## 2016-07-21 DIAGNOSIS — C787 Secondary malignant neoplasm of liver and intrahepatic bile duct: Secondary | ICD-10-CM

## 2016-07-21 LAB — PROTIME-INR
INR: 1.14
Prothrombin Time: 14.7 seconds (ref 11.4–15.2)

## 2016-07-21 LAB — BASIC METABOLIC PANEL
Anion gap: 7 (ref 5–15)
BUN: 5 mg/dL — AB (ref 6–20)
CO2: 23 mmol/L (ref 22–32)
CREATININE: 0.46 mg/dL (ref 0.44–1.00)
Calcium: 8.3 mg/dL — ABNORMAL LOW (ref 8.9–10.3)
Chloride: 109 mmol/L (ref 101–111)
GFR calc Af Amer: >60 mL/min (ref >60)
GFR calc non Af Amer: >60 mL/min (ref >60)
Glucose, Bld: 104 mg/dL — ABNORMAL HIGH (ref 65–99)
Potassium: 4.6 mmol/L (ref 3.5–5.1)
Sodium: 139 mmol/L (ref 135–145)

## 2016-07-21 LAB — COMPREHENSIVE METABOLIC PANEL
ALT: 17 U/L (ref 14–54)
ANION GAP: 13 (ref 5–15)
AST: 28 U/L (ref 15–41)
Albumin: 2.7 g/dL — ABNORMAL LOW (ref 3.5–5.0)
Alkaline Phosphatase: 62 U/L (ref 38–126)
BUN: <5 mg/dL — ABNORMAL LOW (ref 6–20)
CHLORIDE: 105 mmol/L (ref 101–111)
CO2: 19 mmol/L — ABNORMAL LOW (ref 22–32)
CREATININE: 0.73 mg/dL (ref 0.44–1.00)
Calcium: 8.5 mg/dL — ABNORMAL LOW (ref 8.9–10.3)
GFR calc non Af Amer: >60 mL/min (ref >60)
Glucose, Bld: 159 mg/dL — ABNORMAL HIGH (ref 65–99)
Potassium: 2.8 mmol/L — ABNORMAL LOW (ref 3.5–5.1)
Sodium: 137 mmol/L (ref 135–145)
Total Bilirubin: 0.7 mg/dL (ref 0.3–1.2)
Total Protein: 5.6 g/dL — ABNORMAL LOW (ref 6.5–8.1)

## 2016-07-21 LAB — CBC
HCT: 38.2 % (ref 36.0–46.0)
HEMOGLOBIN: 12.4 g/dL (ref 12.0–15.0)
MCH: 29.7 pg (ref 26.0–34.0)
MCHC: 32.5 g/dL (ref 30.0–36.0)
MCV: 91.6 fL (ref 78.0–100.0)
Platelets: 344 10*3/uL (ref 150–400)
RBC: 4.17 MIL/uL (ref 3.87–5.11)
RDW: 12.6 % (ref 11.5–15.5)
WBC: 18.4 10*3/uL — ABNORMAL HIGH (ref 4.0–10.5)

## 2016-07-21 LAB — HIV ANTIBODY (ROUTINE TESTING W REFLEX): HIV Screen 4th Generation wRfx: NONREACTIVE

## 2016-07-21 LAB — LACTIC ACID, PLASMA
Lactic Acid, Venous: 3 mmol/L (ref 0.5–1.9)
Lactic Acid, Venous: 6.2 mmol/L (ref 0.5–1.9)

## 2016-07-21 LAB — MAGNESIUM
Magnesium: 1.5 mg/dL — ABNORMAL LOW (ref 1.7–2.4)
Magnesium: 2.2 mg/dL (ref 1.7–2.4)

## 2016-07-21 LAB — SODIUM, URINE, RANDOM

## 2016-07-21 LAB — PROCALCITONIN: Procalcitonin: 0.19 ng/mL

## 2016-07-21 LAB — MRSA PCR SCREENING: MRSA BY PCR: NEGATIVE

## 2016-07-21 LAB — CHLORIDE, URINE, RANDOM: Chloride Urine: <15 mmol/L

## 2016-07-21 LAB — TSH: TSH: 3.319 u[IU]/mL (ref 0.350–4.500)

## 2016-07-21 LAB — APTT: APTT: 31 s (ref 24–36)

## 2016-07-21 LAB — PHOSPHORUS: Phosphorus: 2.8 mg/dL (ref 2.5–4.6)

## 2016-07-21 MED ORDER — PNEUMOCOCCAL VAC POLYVALENT 25 MCG/0.5ML IJ INJ
0.5000 mL | INJECTION | INTRAMUSCULAR | Status: DC
  Filled 2016-07-21: qty 0.5

## 2016-07-21 MED ORDER — DEXAMETHASONE SODIUM PHOSPHATE 10 MG/ML IJ SOLN
10.0000 mg | INTRAMUSCULAR | Status: DC
  Administered 2016-07-21 – 2016-07-24 (×3): 10 mg via INTRAVENOUS
  Filled 2016-07-21 (×5): qty 1

## 2016-07-21 MED ORDER — MIRTAZAPINE 15 MG PO TABS
15.0000 mg | ORAL_TABLET | Freq: Every day | ORAL | Status: DC
  Administered 2016-07-22 – 2016-07-24 (×3): 15 mg via ORAL
  Filled 2016-07-21 (×4): qty 1

## 2016-07-21 MED ORDER — INFLUENZA VAC SPLIT QUAD 0.5 ML IM SUSY
0.5000 mL | PREFILLED_SYRINGE | INTRAMUSCULAR | Status: DC
  Filled 2016-07-21: qty 0.5

## 2016-07-21 MED ORDER — SODIUM CHLORIDE 0.9 % IV BOLUS (SEPSIS)
1000.0000 mL | Freq: Once | INTRAVENOUS | Status: AC
  Administered 2016-07-21: 1000 mL via INTRAVENOUS

## 2016-07-21 MED ORDER — DILTIAZEM HCL 25 MG/5ML IV SOLN
10.0000 mg | INTRAVENOUS | Status: AC
  Administered 2016-07-21: 10 mg via INTRAVENOUS
  Filled 2016-07-21: qty 5

## 2016-07-21 MED ORDER — METOPROLOL TARTRATE 5 MG/5ML IV SOLN
5.0000 mg | Freq: Four times a day (QID) | INTRAVENOUS | Status: DC
  Administered 2016-07-21 – 2016-07-24 (×11): 5 mg via INTRAVENOUS
  Filled 2016-07-21 (×14): qty 5

## 2016-07-21 MED ORDER — WHITE PETROLATUM GEL
Status: AC
  Administered 2016-07-22: 06:00:00
  Filled 2016-07-21: qty 1

## 2016-07-21 MED ORDER — MAGNESIUM SULFATE 2 GM/50ML IV SOLN
2.0000 g | Freq: Once | INTRAVENOUS | Status: AC
  Administered 2016-07-21: 2 g via INTRAVENOUS
  Filled 2016-07-21: qty 50

## 2016-07-21 MED ORDER — MORPHINE SULFATE (PF) 2 MG/ML IV SOLN
2.0000 mg | INTRAVENOUS | Status: DC | PRN
  Administered 2016-07-21 – 2016-07-22 (×11): 2 mg via INTRAVENOUS
  Filled 2016-07-21 (×11): qty 1

## 2016-07-21 MED ORDER — ACETAMINOPHEN 325 MG PO TABS
650.0000 mg | ORAL_TABLET | Freq: Three times a day (TID) | ORAL | Status: DC
  Administered 2016-07-22 – 2016-07-25 (×8): 650 mg via ORAL
  Filled 2016-07-21 (×9): qty 2

## 2016-07-21 MED ORDER — SODIUM CHLORIDE 0.9 % IV SOLN: INTRAVENOUS | Status: AC

## 2016-07-21 MED ORDER — ALPRAZOLAM 0.25 MG PO TABS: 0.2500 mg | ORAL_TABLET | Freq: Three times a day (TID) | ORAL | Status: DC | PRN

## 2016-07-21 MED ORDER — SODIUM CHLORIDE 0.9 % IV SOLN
30.0000 meq | INTRAVENOUS | Status: AC
  Administered 2016-07-21 (×2): 30 meq via INTRAVENOUS
  Filled 2016-07-21 (×2): qty 15

## 2016-07-21 NOTE — Progress Notes (Signed)
CRITICAL VALUE ALERT  Critical value received:  Lactic acid 6.2  Date of notification:  07/21/16  Time of notification:  0323  Critical value read back: YES  Nurse who received alert:  Johnnye Sima, RN, BSN  MD notified (1st page):  Donnal Debar, NP  Time of first page:  0325  MD notified (2nd page):  Time of second page:  Responding MD:    Time MD responded:

## 2016-07-21 NOTE — Progress Notes (Signed)
Attempted to place patient on 10L high flow nasal cannula without success. Patient dropped to 78% after less than 3 minutes on 10L high flow, RT placed BiPAP back on and administered breathing treatment. RT will continue to monitor.

## 2016-07-21 NOTE — Consult Note (Signed)
PULMONARY / CRITICAL CARE MEDICINE   Name: Felicia Long MRN: [id number] DOB: 25-Jun-1965    ADMISSION DATE:  07/11/2016 CONSULTATION DATE:  07/26/2016   REFERRING MD:  Dr. Roel Cluck  CHIEF COMPLAINT:  Shortness of breath and increased work of breathing  HISTORY OF PRESENT ILLNESS:   Ms. Felicia Long is a 51 y/o long time smoker (quit about 2 weeks ago) who was recently evaluated at [facility] for lung nodules found on a CT scan obtained for shortness of breath.  She has undergone a PET CT which shows multiple FDG avid bilateral pulmonary nodules with a large right sided hilar mass.  She was also found to have multiple liver lesions and FDG avid abdominal and thoracic lymph nodes. She [facility] with worsening shortness of breath and was found to have O2 saturations in the mid 80s on RA.  A CTA was done to rule out PE which confirmed her multiple pulmonary nodules but did not show PE.  She was wheezing on admission and was given multiple albuterol treatments and 1 hour of continuous albuterol.  Upon admission her O2 saturations improved on 3L [place], however when she arrived on 4E she was noted to have significantly increased WOB, tachypnea and tachycardia.  She was placed on BiPap with improvement in her O2 sats as well as her WOB.  Her ABG on 3L  was 7.44/26/71 with a serum CO2 of 20.  Pulmonary was consulted due to her shortness of breath and possible need for a higher level of care.   PAST MEDICAL HISTORY :  Multiple pulmonary nodules found in the last month  PAST SURGICAL HISTORY: She  has a past surgical history that includes Tubal ligation.  No Known Allergies  No current facility-administered medications on file prior to encounter.    No current outpatient prescriptions on file prior to encounter.    FAMILY HISTORY:  Her indicated that the status of her mother is unknown. She indicated that the status of her other is unknown.    SOCIAL HISTORY: She  reports that she quit smoking 12 days ago.  Her smoking use included Cigarettes. She has never used smokeless tobacco. She reports that she does not drink alcohol or use drugs.  REVIEW OF SYSTEMS:   She has had weight loss over the last month, she denies fever, chills or productive cough.  She has had a dry cough for the last month.   SUBJECTIVE:  51 y/o woman with right sided hilar mass and inumerable pulmonary nodules consistent with metastatic disease.    VITAL SIGNS: BP 122/81 (BP Location: Right Arm)   Pulse (!) 158   Temp 98.2 F (36.8 C) (Oral)   Resp (!) 36   LMP 07/06/2016 (Approximate)   SpO2 97%   HEMODYNAMICS:    VENTILATOR SETTINGS: FiO2 (%):  [40 %-60 %] 60 %  INTAKE / OUTPUT: I/O last 3 completed shifts: In: 500 [I.V.:500] Out: 1500 [Urine:1500]  PHYSICAL EXAMINATION: General:  Laying in bed, BiPap in place, answering questions and giving thumbs up Neuro:  Alert and oriented x4 HEENT:  PERRL, EOMI, Bipap mask in place Cardiovascular:  Tachycardic, regular rhythm, no MRG Lungs:  Expiratory wheezes in bilateral upper lobes, relatively clear otherwise Abdomen:  Soft, non-tender, no palpable HSM, +BS Musculoskeletal:  No joint abnormalities, wearing acrylic nails, ?clubbing of toes, slightly prolonged capillary refill Skin:  No rashes or other lesions  LABS:  BMET  Recent Labs Lab 07/17/2016 1354  NA 131*  K 3.5  CL 101  CO2 20*  BUN <5*  CREATININE 0.50  GLUCOSE 130*    Electrolytes  Recent Labs Lab 07/24/2016 1354  CALCIUM 9.5    CBC  Recent Labs Lab 07/16/2016 1354  WBC 13.6*  HGB 14.0  HCT 40.3  PLT 476*    Coag's  Recent Labs Lab 07/14/2016 1543  INR 1.04    Sepsis Markers  Recent Labs Lab 07/23/2016 1543 08/02/2016 1817 07/11/2016 2305  LATICACIDVEN 1.2 2.0* 3.0*  PROCALCITON  --   --  0.19    ABG  Recent Labs Lab 07/18/2016 2202  PHART 7.442  PCO2ART 25.9*  PO2ART 71.0*    Liver Enzymes  Recent Labs Lab 07/16/2016 1354  AST 27  ALT 19  ALKPHOS 73   BILITOT 0.5  ALBUMIN 3.4*    Cardiac Enzymes  Recent Labs Lab 07/10/2016 1543  TROPONINI <0.03    Glucose No results for input(s): GLUCAP in the last 168 hours.  Imaging Dg Chest 2 View  Result Date: 07/18/2016 CLINICAL DATA:  Severe shortness of breath. Nonproductive cough. Recent PET scan that demonstrated probable primary malignancy of the lung as well as pulmonary and hepatic metastases as well as extensive adenopathy in the chest, abdomen, and pelvis. EXAM: CHEST  2 VIEW COMPARISON:  Report of PET-CT scan dated 07/17/2016 FINDINGS: The patient has a 4.3 cm right hilar mass as well as innumerable metastatic nodules in both lungs. There are no consolidative infiltrates or effusions. The heart size is normal. Bones appear normal. IMPRESSION: Right perihilar lung mass. Innumerable metastatic lesions throughout both lungs. Electronically Signed   By: Lorriane Shire M.D.   On: 07/27/2016 16:37   Ct Angio Chest Pe W And/or Wo Contrast  Result Date: 07/17/2016 CLINICAL DATA:  Shortness of breath with dry cough for 2 months EXAM: CT ANGIOGRAPHY CHEST WITH CONTRAST TECHNIQUE: Multidetector CT imaging of the chest was performed using the standard protocol during bolus administration of intravenous contrast. Multiplanar CT image reconstructions and MIPs were obtained to evaluate the vascular anatomy. CONTRAST:  100 cc Isovue 370 intravenous COMPARISON:  Chest x-ray 07/11/2016 FINDINGS: Cardiovascular: Satisfactory opacification of the pulmonary arteries to the segmental level. No evidence of pulmonary embolism. Encasement and significant narrowing of multiple right upper lobe pulmonary artery branch vessels. Non aneurysmal aorta. No dissection. Normal heart size. No pericardial effusion. Mediastinum/Nodes: Extensive mediastinal adenopathy. Nodal mass anterior to the trachea measures 1.8 x 2 cm. Matted adenopathy within the AP window and sub- carinal space. Subcarinal soft tissue mass measures 2.6 cm.  Multiple enlarged bilateral hilar nodes. Suspected right hilar which is contiguous with consolidation an or mass in the anterior right upper lobe, this measures 5.6 cm AP by 6.7 cm oblique axial. Trachea midline. Diffuse peribronchial soft tissue mass an or thickening. Esophagus is nonenlarged. Lungs/Pleura: Innumerable pulmonary masses and nodules. No pleural effusion. Focal consolidative process an or tumor in the right upper lobe contiguous with right hilar soft tissue mass. Upper Abdomen: Multiple ill-defined masses within the liver, the largest is seen within the left hepatic lobe and measures at least 7.1 cm. Imaged adrenal glands, spleen, and upper kidneys are within normal limits. Imaged pancreas normal. Musculoskeletal: No acute or suspicious bone lesion Review of the MIP images confirms the above findings. IMPRESSION: 1. No definite acute pulmonary embolus or aortic dissection 2. Right hilar mass which is contiguous with consolidation and or mass in the anterior portion of the right upper lobe. There is tumor encasement of multiple right upper lobe  pulmonary arterial branches. 3. Extensive mediastinal and hilar adenopathy, consistent with metastatic disease. Diffuse bilateral peribronchial soft tissue thickening. 4. Innumerable pulmonary nodules and masses, consistent with metastatic disease. 5. Multiple ill-defined masses within the liver, consistent with hepatic metastatic disease. Electronically Signed   By: Donavan Foil M.D.   On: 07/06/2016 18:16     STUDIES:  CTA - 08/02/2016 - Right hilar mass with innumerable pulmonary nodules  CULTURES: Blood - 07/16/2016  ANTIBIOTICS: Vanc 07/26/2016 --> Zosyn 08/03/2016 -->  SIGNIFICANT EVENTS: BiPap started 07/09/2016   LINES/TUBES:   DISCUSSION: 51 y/o woman with likely widely metastatic lung cancer presented with worsening shortness of breath.  Improved with BiPap however low threshold for transfer to ICU if worsens.  ASSESSMENT /  PLAN:  PULMONARY A: Dyspnea P:   Worsening dyspnea after admission - unclear etiology - ? Pulmonary edema after fluid administration, ?V/Q mismatch after albuterol administration, ?significant tachycardia 2/2 to albuterol causing worsening anxiety and dyspnea?  Treat with BiPap for now although pt is hyperventilating so will need to monitor CO2 closely.  May end up doing better with just CPAP or high flow [place].   CARDIOVASCULAR A:  tachycardia P:  ? 2/2 continuous albuterol.  Given dilt '10mg'$  x1 and 1L fluid bolus.  Appears to be sinus tach.  Will monitor.  RENAL A:   Hyponatremia Lactic acidosis P:   LA trending up since admission - likely due to continuous albuterol, will continue to follow Urine sodium and chloride pending - concern for possible SIADH with lung mass however spec grav on UA only 1.002 and urine does not appear concentrated.    INFECTIOUS A:   Possible pna P:   vanc/zosyn pending blood culture results  NEUROLOGIC A:   At risk for brain mets P:   Will need brain MRI to r/o brain mets   FAMILY  - Updates:  Family at bedside and updated - Inter-disciplinary family meet or Palliative Care meeting due by:  07/27/16    Pulmonary and [place] Pager: [phone]  07/21/2016, 12:26 AM

## 2016-07-21 NOTE — Progress Notes (Signed)
PROGRESS NOTE    Felicia Long  XQJ:[id number] DOB: [birth year] DOA: 07/11/2016 PCP: Elliot Dally, MD    Brief Narrative:  51 y.o. female with medical history significant of a diagnosed lung mass and rectal mass with likely metastatic widespread    Presented with worsening dyspnea and shortness of breath and associated chest pain worsen her baseline. She can hardly walk without severe dyspnea which improved with rest. No leg swelling or calf pain.  Patient was found to have new diagnosis of diffuse lung masses as well as masses to her colon and liver few weeks ago. She was seen on 26 over every by her PCP with 2 weeks of cough and worsening shortness of breath she had denied any fevers or chills. D-dimer was elevated CT angiogram was done showing multiple lung masses and she was seen by pulmonology on March 2nd was told that CT scan was worrisome for malignancy due to metastases ordered stat PET scan Patient quit smoking only recently. 15 pound weight loss over past 6 weeks unintentional reports mild hematochezia and constipation no hemoptysis. Denies fevers or chills Denies any prior malignancy history area reports family history of breast cancer never had a mammogram or colonoscopy.  Patient used to drink heavily but all now will have cut down and drinks socially only.  Assessment & Plan:   Active Problems:   Lung cancer ([place])   Acute respiratory failure with hypoxia ([facility])   Leukocytosis   Respiratory distress   Tachycardia   Dehydration   SIRS (systemic inflammatory response syndrome) ([place])   1. Acute hypoxemic respiratory failure 1. Patient currently BiPAP dependent 2. PCCM consulted at time of admission 3. Continue supplemental O2 as needed 4. Suspect secondary to combination of metastatic lung cancer with PNA, likely post-obstructive 2. Likely metastatic lung cancer 1. Chest CT personally reviewed 2. Innumerable metastatic lesions noted in bilateral lungs as well as in  liver 3. Pt also noted to have  Rectal masses 4. Oncology consulted and is following. Recs reviewed. Oncology plans for IR guided CT biopsy of liver with anticipation for port placement of palliative chemo 5. Overall poor prognosis 6. Palliative Care on board. Very much appreciate input 3. Pneumonia with sepsis present on admission 1. Patient is continued on empiric vanc and zosyn 2. MRSA swab is negative. Will d/c vanc 4. Leukocytosis 1. Suspect secondary to PNA vs metastatic disease 2. Will repeat CBC in AM 5. Tachycardia 1. Suspect related to presenting PNA with metastatic disease 2. Improved with IV cardizem this AM 3. HR currently improved with scheduled IV lopressor 6. Hx tobacco abuse 1. Patient reportedly quit smoking two weeks prior to admission 2. stable 7. Hx ETOH abuse 1. Prior heavy ETOH intake 2. Patient states quitting two weeks piror to admission 8. Hypokalemia 1. Replaced overnight 2. Will repeat BMET this afternoon and cont to correct if needed 9. Hypomagnesemia 1. Noted to be low overnight, replaced 2. Will repeat level this afternoon, continue to correct if needed  DVT prophylaxis: Lovenox subQ Code Status: Full Family Communication: Pt in room, pt's daughter at bedside Disposition Plan: Uncertain at this time  Consultants:   PCCM  Oncology  Palliative Care  Procedures:     Antimicrobials: Anti-infectives    Start     Dose/Rate Route Frequency Ordered Stop   07/16/2016 2245  piperacillin-tazobactam (ZOSYN) IVPB 3.375 g     3.375 g 12.5 mL/hr over 240 Minutes Intravenous Every 8 hours 07/31/2016 2232     07/08/2016 2245  vancomycin (VANCOCIN) 500 mg in sodium chloride 0.9 % 100 mL IVPB     500 mg 100 mL/hr over 60 Minutes Intravenous Every 12 hours 07/08/2016 2232         Subjective: Reports breathing better on bipap  Objective: Vitals:   07/21/16 0809 07/21/16 1255 07/21/16 1303 07/21/16 1400  BP: 121/74 110/71 103/69 109/76  Pulse: (!) 137  (!) 107 87 (!) 110  Resp: (!) 33 (!) 30 (!) 33 (!) 30  Temp:  99.6 F (37.6 C)  99.4 F (37.4 C)  TempSrc:  Axillary  Oral  SpO2: 95% 93% 94% 95%  Weight:      Height:        Intake/Output Summary (Last 24 hours) at 07/21/16 1419 Last data filed at 07/21/16 1349  Gross per 24 hour  Intake          4126.67 ml  Output             3425 ml  Net           701.67 ml   Filed Weights   07/21/16 0030  Weight: 47.8 kg (105 lb 6.1 oz)    Examination:  General exam: Appears calm and comfortable  Respiratory system: Clear to auscultation. Respiratory effort increased Cardiovascular system: S1 & S2 heard, tachycardia Gastrointestinal system: Abdomen is nondistended, soft and nontender. No organomegaly or masses felt. Normal bowel sounds heard. Central nervous system: Alert and oriented. No focal neurological deficits. Extremities: Symmetric 5 x 5 power. Skin: No rashes, lesions Psychiatry: Judgement and insight appear normal. Mood & affect appropriate.   Data Reviewed: I have personally reviewed following labs and imaging studies  CBC:  Recent Labs Lab 07/19/2016 1354 07/21/16 0242  WBC 13.6* 18.4*  NEUTROABS 9.9*  --   HGB 14.0 12.4  HCT 40.3 38.2  MCV 89.2 91.6  PLT 476* 683   Basic Metabolic Panel:  Recent Labs Lab 08/04/2016 1354 07/21/16 0242  NA 131* 137  K 3.5 2.8*  CL 101 105  CO2 20* 19*  GLUCOSE 130* 159*  BUN <5* <5*  CREATININE 0.50 0.73  CALCIUM 9.5 8.5*  MG  --  1.5*  PHOS  --  2.8   GFR: Estimated Creatinine Clearance: 64.9 mL/min (by C-G formula based on SCr of 0.73 mg/dL). Liver Function Tests:  Recent Labs Lab 07/19/2016 1354 07/21/16 0242  AST 27 28  ALT 19 17  ALKPHOS 73 62  BILITOT 0.5 0.7  PROT 6.8 5.6*  ALBUMIN 3.4* 2.7*   No results for input(s): LIPASE, AMYLASE in the last 168 hours. No results for input(s): AMMONIA in the last 168 hours. Coagulation Profile:  Recent Labs Lab 07/31/2016 1543 07/21/16 0242  INR 1.04 1.14    Cardiac Enzymes:  Recent Labs Lab 07/11/2016 1543  TROPONINI <0.03   BNP (last 3 results) No results for input(s): PROBNP in the last 8760 hours. HbA1C: No results for input(s): HGBA1C in the last 72 hours. CBG: No results for input(s): GLUCAP in the last 168 hours. Lipid Profile: No results for input(s): CHOL, HDL, LDLCALC, TRIG, CHOLHDL, LDLDIRECT in the last 72 hours. Thyroid Function Tests:  Recent Labs  07/21/16 0242  TSH 3.319   Anemia Panel: No results for input(s): VITAMINB12, FOLATE, FERRITIN, TIBC, IRON, RETICCTPCT in the last 72 hours. Sepsis Labs:  Recent Labs Lab 07/31/2016 1543 07/24/2016 1817 07/27/2016 2305 07/21/16 0242  PROCALCITON  --   --  0.19  --   LATICACIDVEN 1.2 2.0* 3.0* 6.2*  Recent Results (from the past 240 hour(s))  MRSA PCR Screening     Status: None   Collection Time: 07/11/2016 10:50 PM  Result Value Ref Range Status   MRSA by PCR NEGATIVE NEGATIVE Final    Comment:        The GeneXpert MRSA Assay (FDA approved for NASAL specimens only), is one component of a comprehensive MRSA colonization surveillance program. It is not intended to diagnose MRSA infection nor to guide or monitor treatment for MRSA infections.      Radiology Studies: Dg Chest 2 View  Result Date: 07/09/2016 CLINICAL DATA:  Severe shortness of breath. Nonproductive cough. Recent PET scan that demonstrated probable primary malignancy of the lung as well as pulmonary and hepatic metastases as well as extensive adenopathy in the chest, abdomen, and pelvis. EXAM: CHEST  2 VIEW COMPARISON:  Report of PET-CT scan dated 07/17/2016 FINDINGS: The patient has a 4.3 cm right hilar mass as well as innumerable metastatic nodules in both lungs. There are no consolidative infiltrates or effusions. The heart size is normal. Bones appear normal. IMPRESSION: Right perihilar lung mass. Innumerable metastatic lesions throughout both lungs. Electronically Signed   By: Lorriane Shire  M.D.   On: 07/21/2016 16:37   Ct Angio Chest Pe W And/or Wo Contrast  Result Date: 07/05/2016 CLINICAL DATA:  Shortness of breath with dry cough for 2 months EXAM: CT ANGIOGRAPHY CHEST WITH CONTRAST TECHNIQUE: Multidetector CT imaging of the chest was performed using the standard protocol during bolus administration of intravenous contrast. Multiplanar CT image reconstructions and MIPs were obtained to evaluate the vascular anatomy. CONTRAST:  100 cc Isovue 370 intravenous COMPARISON:  Chest x-ray 07/31/2016 FINDINGS: Cardiovascular: Satisfactory opacification of the pulmonary arteries to the segmental level. No evidence of pulmonary embolism. Encasement and significant narrowing of multiple right upper lobe pulmonary artery branch vessels. Non aneurysmal aorta. No dissection. Normal heart size. No pericardial effusion. Mediastinum/Nodes: Extensive mediastinal adenopathy. Nodal mass anterior to the trachea measures 1.8 x 2 cm. Matted adenopathy within the AP window and sub- carinal space. Subcarinal soft tissue mass measures 2.6 cm. Multiple enlarged bilateral hilar nodes. Suspected right hilar which is contiguous with consolidation an or mass in the anterior right upper lobe, this measures 5.6 cm AP by 6.7 cm oblique axial. Trachea midline. Diffuse peribronchial soft tissue mass an or thickening. Esophagus is nonenlarged. Lungs/Pleura: Innumerable pulmonary masses and nodules. No pleural effusion. Focal consolidative process an or tumor in the right upper lobe contiguous with right hilar soft tissue mass. Upper Abdomen: Multiple ill-defined masses within the liver, the largest is seen within the left hepatic lobe and measures at least 7.1 cm. Imaged adrenal glands, spleen, and upper kidneys are within normal limits. Imaged pancreas normal. Musculoskeletal: No acute or suspicious bone lesion Review of the MIP images confirms the above findings. IMPRESSION: 1. No definite acute pulmonary embolus or aortic  dissection 2. Right hilar mass which is contiguous with consolidation and or mass in the anterior portion of the right upper lobe. There is tumor encasement of multiple right upper lobe pulmonary arterial branches. 3. Extensive mediastinal and hilar adenopathy, consistent with metastatic disease. Diffuse bilateral peribronchial soft tissue thickening. 4. Innumerable pulmonary nodules and masses, consistent with metastatic disease. 5. Multiple ill-defined masses within the liver, consistent with hepatic metastatic disease. Electronically Signed   By: Donavan Foil M.D.   On: 07/19/2016 18:16    Scheduled Meds: . enoxaparin (LOVENOX) injection  40 mg Subcutaneous Q24H  . [START ON  07/22/2016] Influenza vac split quadrivalent PF  0.5 mL Intramuscular Tomorrow-1000  . ipratropium-albuterol  3 mL Nebulization Q4H  . levalbuterol  1.89 mg Nebulization Once  . metoprolol  5 mg Intravenous Q6H  . piperacillin-tazobactam  3.375 g Intravenous Q8H  . [START ON 07/22/2016] pneumococcal 23 valent vaccine  0.5 mL Intramuscular Tomorrow-1000  . sodium chloride flush  3 mL Intravenous Q12H  . vancomycin  500 mg Intravenous Q12H   Continuous Infusions: . albuterol       LOS: 1 day   CHIU, Orpah Melter, MD [facility] Pager [phone]  If 7PM-7AM, please contact night-coverage [email or web address] Password [email or web address] 07/21/2016, 2:19 PM

## 2016-07-21 NOTE — Consult Note (Addendum)
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 She was worked up for a lung mass on 3/2 but rapidly deteriorate at home and is now requiring Bipap found to have extensive tumor spread through her chest, abdomen and pelvis.  She is frail and thin, has been experiencing profound fatigue and anxiety which was being treated with anti-depressants.  Clinical Assessment and Goals of Care: Felicia Long is on Bipap at this time but very alert and appropriate given these circumstances- family is at bedside including her husband, daughter, mother and sister.  Felicia Long is young and this is a very difficult and unexpected diagnosis-my impression is that they are all just processing the news and what is happening- they are grieving and also having feelings of disbelief and shock that this is happening. They report feeling very vulnerable and worried-the waiting for them is the most difficult part- they want to get the biopsy as soon as possible so they know what they are dealing with - they allowed me to discuss the "what if" -we talked more about code status-she clearly values QOL and outside of a short term intubation for reversible condition she does not want her life prolonged if there is no cure for her condition or the treatment causes serious toxicities.  We talked about not pursuing palliative Chemo/treatment as a choice that in some cases may interfere with the quality of time  she has left- I had them consider how she wants to spend the time she has left -they are thinking on this deeply- they need time and space and reassurance that we are doing alll that is medically reasonable and will help- which may be just comfort care. We have agreed to wait for the biopsy results- she is fragile and I worry about her need for ICU transfer and have prepared them for this possibility. I advised begin to think about getting affairs in order-and FMLA paperwork.  We will work on ACP and other documents is she becomes more stable.  NEXT OF KIN    SUMMARY OF RECOMMENDATIONS    Code Status/Advance Care Planning:  Full code    Symptom Management:   Tylenol scheduled TID  Morphine PRN  Mirtazapine QHS  PRN low dose alprazolam  Decadron to help with airway inflammation  Palliative Prophylaxis:   Bowel Regimen and Frequent Pain Assessment  Additional Recommendations (Limitations, Scope, Preferences):  Full Scope Treatment  Psycho-social/Spiritual:   Desire for further Chaplaincy support:yes  Additional Recommendations: Caregiving  Support/Resources  Prognosis:   < 6 months  Discharge Planning: To Be Determined  Primary Diagnoses: Present on Admission: . Lung cancer ([place]) . Acute respiratory failure with hypoxia ([place]) . Leukocytosis . Respiratory distress . SIRS (systemic inflammatory response syndrome) ([facility])   I have reviewed the medical record, interviewed the patient and family, and examined the patient. The following aspects are pertinent.  History reviewed. No pertinent past medical history. Social History   Social History  . Marital status: Married    Spouse name: N/A  . Number of children: N/A  . Years of education: N/A   Social History Main Topics  . Smoking status: Former Smoker    Types: Cigarettes    Quit date: 07/09/2016  . Smokeless tobacco: Never Used  . Alcohol use No     Comment: quit 1 month  . Drug use: No  .  Sexual activity: Not Asked   Other Topics Concern  . None   Social History Narrative  . None   Family History  Problem Relation Age of Onset  . Breast cancer Mother   . Lung cancer Other   . CAD Other    Scheduled Meds: . enoxaparin (LOVENOX) injection  40 mg Subcutaneous Q24H  . [START ON 07/22/2016] Influenza vac split quadrivalent PF  0.5 mL Intramuscular Tomorrow-1000  . ipratropium-albuterol  3 mL Nebulization Q4H  . levalbuterol  1.89 mg Nebulization Once  . metoprolol  5 mg Intravenous Q6H  . piperacillin-tazobactam  3.375 g Intravenous Q8H  . [START ON 07/22/2016] pneumococcal 23 valent vaccine  0.5 mL Intramuscular Tomorrow-1000  . sodium chloride flush  3 mL Intravenous Q12H   Continuous Infusions: . albuterol     PRN Meds:.acetaminophen **OR** acetaminophen, HYDROcodone-acetaminophen, levalbuterol, morphine injection, ondansetron **OR** ondansetron (ZOFRAN) IV Medications Prior to Admission:  Prior to Admission medications   Medication Sig Start Date End Date Taking? Authorizing Provider  acetaminophen (TYLENOL) 500 MG tablet Take 1,000 mg by mouth every 6 (six) hours as needed for mild pain.   Yes Historical Provider, MD  ibuprofen (ADVIL,MOTRIN) 200 MG tablet Take 800 mg by mouth every 6 (six) hours as needed for mild pain.   Yes Historical Provider, MD  temazepam (RESTORIL) 30 MG capsule Take 30 mg by mouth at bedtime as needed for sleep. 07/11/16  Yes Historical Provider, MD   No Known Allergies Review of Systems  Physical Exam  Vital Signs: BP 109/76   Pulse (!) 110   Temp 99.4 F (37.4 C) (Oral)   Resp (!) 30   Ht '5\' 2"'$  (1.575 m)   Wt 47.8 kg (105 lb 6.1 oz)   LMP 07/06/2016 (Approximate)   SpO2 95%   BMI 19.27 kg/m  Pain Assessment: 0-10   Pain Score: 4    SpO2: SpO2: 95 % O2 Device:SpO2: 95 % O2 Flow Rate: .O2 Flow Rate (L/min): 2 L/min  IO: Intake/output summary:  Intake/Output Summary (Last 24 hours) at 07/21/16 1430 Last data filed at  07/21/16 1349  Gross per 24 hour  Intake          4126.67 ml  Output             3425 ml  Net           701.67 ml    LBM: Last BM Date: 07/23/2016 Baseline Weight: Weight: 47.8 kg (105 lb 6.1 oz) Most recent weight: Weight: 47.8 kg (105 lb 6.1 oz)     Palliative Assessment/Data:   Flowsheet Rows     Most Recent Value  Intake Tab  Referral Department  Oncology  Unit at Time of Referral  Cardiac/Telemetry Unit  Palliative Care Primary Diagnosis  Cancer  Date Notified  07/21/16  Palliative Care Type  New Palliative care  Reason for referral  Clarify Goals of Care, Advance Care Planning  Date of Admission  08/04/2016  Date first seen by Palliative Care  07/21/16  # of days Palliative referral response time  0 Day(s)  # of days IP prior to Palliative referral  1  Clinical Assessment  Palliative Performance Scale Score  30%  Pain Max last 24 hours  9  Pain Min Last 24 hours  1  Dyspnea Max Last 24 Hours  10  Dyspnea Min Last 24 hours  7  Nausea Max Last 24 Hours  0  Nausea Min Last 24 Hours  0  Anxiety Max Last 24 Hours  6  Anxiety Min Last 24 Hours  6  Psychosocial & Spiritual Assessment  Palliative Care Outcomes  Palliative Care Outcomes  Clarified goals of care, Provided psychosocial or spiritual support       Time Total: 70 minutes Greater than 50%  of this time was spent counseling and coordinating care related to the above assessment and plan.  Signed by: Lane Hacker, DO   Please contact Palliative Medicine Team phone at [phone] for questions and concerns.  For individual provider: See Shea Evans

## 2016-07-21 NOTE — Consult Note (Signed)
[facility] CONSULT NOTE  Patient Care Team: Elliot Dally, MD as PCP - General (Internal Medicine)  CHIEF COMPLAINTS/PURPOSE OF CONSULTATION:  Newly diagnosed metastatic cancer  HISTORY OF PRESENTING ILLNESS:  Felicia Long 51 y.o. female is here because of recent diagnosis of metastatic cancer. Patient is a prior smoker who presented with 2 months history of shortness of breath. She was evaluated at no one with a PET/CT scan that showed a very large right hilar mass with extensive liver metastases including a 9.1 cm liver lesion in addition to thoracic and abdominal lymph nodes and a rectal mass. She presented to our hospital with worsening shortness of breath and a CT of the chest was performed. This did not show any pulmonary embolism but it showed fairly extensive tumor that is encasing multiple right upper lobe pulmonary arteries branches. She has had profound tachycardia and tachypnea for which she is on BiPAP.  I reviewed her records extensively and collaborated the history with the patient.   MEDICAL HISTORY:  History reviewed. No pertinent past medical history.  SURGICAL HISTORY: Past Surgical History:  Procedure Laterality Date  . TUBAL LIGATION      SOCIAL HISTORY: Social History   Social History  . Marital status: Married    Spouse name: N/A  . Number of children: N/A  . Years of education: N/A   Occupational History  . Not on file.   Social History Main Topics  . Smoking status: Former Smoker    Types: Cigarettes    Quit date: 07/09/2016  . Smokeless tobacco: Never Used  . Alcohol use No     Comment: quit 1 month  . Drug use: No  . Sexual activity: Not on file   Other Topics Concern  . Not on file   Social History Narrative  . No narrative on file    FAMILY HISTORY: Family History  Problem Relation Age of Onset  . Breast cancer Mother   . Lung cancer Other   . CAD Other     ALLERGIES:  has No Known Allergies.  MEDICATIONS:   Current Facility-Administered Medications  Medication Dose Route Frequency Provider Last Rate Last Dose  . acetaminophen (TYLENOL) tablet 650 mg  650 mg Oral Q6H PRN Toy Baker, MD       Or  . acetaminophen (TYLENOL) suppository 650 mg  650 mg Rectal Q6H PRN Toy Baker, MD   650 mg at 07/21/16 0820  . albuterol (PROVENTIL,VENTOLIN) solution continuous neb  15 mg Nebulization Continuous Toy Baker, MD   15 mg at 07/10/2016 2143  . enoxaparin (LOVENOX) injection 40 mg  40 mg Subcutaneous Q24H Toy Baker, MD      . HYDROcodone-acetaminophen (NORCO/VICODIN) 5-325 MG per tablet 1-2 tablet  1-2 tablet Oral Q4H PRN Toy Baker, MD      . Derrill Memo ON 07/22/2016] Influenza vac split quadrivalent PF (FLUARIX) injection 0.5 mL  0.5 mL Intramuscular Tomorrow-1000 Anastassia Doutova, MD      . ipratropium-albuterol (DUONEB) 0.5-2.5 (3) MG/3ML nebulizer solution 3 mL  3 mL Nebulization Q4H Merrily Pew, MD   3 mL at 07/21/16 0809  . levalbuterol (XOPENEX) nebulizer solution 0.63 mg  0.63 mg Nebulization Q2H PRN Toy Baker, MD      . levalbuterol (XOPENEX) nebulizer solution 1.89 mg  1.89 mg Nebulization Once Merrily Pew, MD      . morphine 2 MG/ML injection 2 mg  2 mg Intravenous Q3H PRN Toy Baker, MD   2 mg at 07/21/16  0306  . ondansetron (ZOFRAN) tablet 4 mg  4 mg Oral Q6H PRN Toy Baker, MD       Or  . ondansetron (ZOFRAN) injection 4 mg  4 mg Intravenous Q6H PRN Toy Baker, MD      . piperacillin-tazobactam (ZOSYN) IVPB 3.375 g  3.375 g Intravenous Q8H Toy Baker, MD   3.375 g at 07/21/16 5621  . [START ON 07/22/2016] pneumococcal 23 valent vaccine (PNU-IMMUNE) injection 0.5 mL  0.5 mL Intramuscular Tomorrow-1000 Anastassia Doutova, MD      . potassium chloride 30 mEq in sodium chloride 0.9 % 265 mL (KCL MULTIRUN) IVPB  30 mEq Intravenous Q3H Ritta Slot, NP   30 mEq at 07/21/16 0521  . sodium chloride flush (NS) 0.9 % injection 3  mL  3 mL Intravenous Q12H Toy Baker, MD   3 mL at 07/11/2016 2324  . vancomycin (VANCOCIN) 500 mg in sodium chloride 0.9 % 100 mL IVPB  500 mg Intravenous Q12H Toy Baker, MD   500 mg at 07/19/2016 2323    REVIEW OF SYSTEMS:   Constitutional: Denies fevers, chills or abnormal night sweats Eyes: Denies blurriness of vision, double vision or watery eyes Ears, nose, mouth, throat, and face: Denies mucositis or sore throat Respiratory: Severe shortness of breath  Cardiovascular: Palpitations Gastrointestinal:  Denies nausea, heartburn or change in bowel habits Skin: Denies abnormal skin rashes Lymphatics: Denies new lymphadenopathy or easy bruising Neurological:Denies numbness, tingling or new weaknesses Behavioral/Psych: Depressed  All other systems were reviewed with the patient and are negative.  PHYSICAL EXAMINATION: ECOG PERFORMANCE STATUS: 3 - Symptomatic, >50% confined to bed  Vitals:   07/21/16 0700 07/21/16 0809  BP: 114/63 121/74  Pulse: (!) 137 (!) 137  Resp: (!) 32 (!) 33  Temp: (!) 102.4 F (39.1 C)    Filed Weights   07/21/16 0030  Weight: 105 lb 6.1 oz (47.8 kg)    GENERAL:alert, no distress and comfortable SKIN: skin color, texture, turgor are normal, no rashes or significant lesions EYES: normal, conjunctiva are pink and non-injected, sclera clear OROPHARYNX:no exudate, no erythema and lips, buccal mucosa, and tongue normal  NECK: supple, thyroid normal size, non-tender, without nodularity LYMPH:  no palpable lymphadenopathy in the cervical, axillary or inguinal LUNGS: Severely diminished breath sounds HEART: Severe tachycardia ABDOMEN:abdomen soft, non-tender and normal bowel sounds Musculoskeletal:no cyanosis of digits and no clubbing  PSYCH: Depressed  NEURO: no focal motor/sensory deficits  LABORATORY DATA:  I have reviewed the data as listed Lab Results  Component Value Date   WBC 18.4 (H) 07/21/2016   HGB 12.4 07/21/2016   HCT 38.2  07/21/2016   MCV 91.6 07/21/2016   PLT 344 07/21/2016   Lab Results  Component Value Date   NA 137 07/21/2016   K 2.8 (L) 07/21/2016   CL 105 07/21/2016   CO2 19 (L) 07/21/2016    RADIOGRAPHIC STUDIES: I have personally reviewed the radiological reports and agreed with the findings in the report.  ASSESSMENT AND PLAN:  1. Metastatic cancer: It appears to be highly suspicious for lung cancer that has metastasized to the liver. However the rectal masses what is confusing. I discussed with the patient that we would like to obtain a biopsy of the liver lesion. I will request interventional radiology to do a CT-guided biopsy of the liver. Being the week and unfortunately this cannot be accomplished before Monday. I anticipate that she will also require chemotherapy and I will request a port placement as well.  Should distance up to be small cell, she will need inpatient chemotherapy.  2. Prognosis: I discussed with the patient that metastatic cancer is incurable goals of treatment are palliation and prolongation of life. 3. Chemotherapy concomitant with profound toxicities and I briefly explained to the patient about the risks including risk of infection, lowering of blood counts, hair loss, nausea and vomiting, organ dysfunction and even death. Patient is anxious but is willing to go through any length.  Unfortunately until we see the tissue diagnosis there is not much we can recommend at this time. I will consult with radiation oncology on Monday to determine if thoracic radiation may be necessary to prevent bleeding risk from the tumor eroding into the mammary arteries. I discussed her situation with her husband and her daughter as well.  All questions were answered. The patient knows to call the clinic with any problems, questions or concerns.    Rulon Eisenmenger, MD '@T'$ @

## 2016-07-21 NOTE — Progress Notes (Signed)
Initial Nutrition Assessment  DOCUMENTATION CODES:   Severe malnutrition in context of chronic illness  INTERVENTION:   Once pt is able to wean off BiPAP and diet advanced will supplement diet as appropriate.   NUTRITION DIAGNOSIS:   Malnutrition (Severe) related to chronic illness (cancer) as evidenced by severe depletion of muscle mass, severe depletion of body fat, 12.5 percent weight loss x 3 months.  GOAL:   Patient will meet greater than or equal to 90% of their needs  MONITOR:   Diet advancement, PO intake, Supplement acceptance  REASON FOR ASSESSMENT:   Malnutrition Screening Tool    ASSESSMENT:   Patient is a prior smoker who presented with 2 months history of shortness of breath. She was evaluated with a PET/CT scan that showed a very large right hilar mass with extensive liver metastases including a 9.1 cm liver lesion in addition to thoracic and abdominal lymph nodes and a rectal mass. Oncology has been consulted and awaiting biopsy for treatment recommendations.    Pt on BiPAP and unable to remove due to poor O2 stats. She is alert and able to answer questions. Multiple family members at the bedside. She reports 20 lb weight loss since 1/18. This is a 12.5% weight loss x 3 months.  She has had a decreased appetite but unable to tell me what she has been eating due to poor O2 stats.  Palliative care meeting with family as this metastatic disease is a new dx for pt.   Nutrition-Focused physical exam completed. Findings are severe fat depletion, severe muscle depletion, and no edema.    K+ 2.8, magnesium 1.5  Diet Order:  Diet NPO time specified Except for: Ice Chips  Skin:  Reviewed, no issues  Last BM:  3/16  Height:   Ht Readings from Last 1 Encounters:  07/21/16 '5\' 2"'$  (1.575 m)    Weight:   Wt Readings from Last 1 Encounters:  07/21/16 105 lb 6.1 oz (47.8 kg)    Ideal Body Weight:  50 kg  BMI:  Body mass index is 19.27 kg/m.  Estimated  Nutritional Needs:   Kcal:  1500-1700  Protein:  70-80 grams  Fluid:  > 1.5 L/day  EDUCATION NEEDS:   No education needs identified at this time  [place], [place], [place] Pager [phone] After Hours Pager

## 2016-07-21 NOTE — Progress Notes (Signed)
Pt unable to urinate and c/o needing to void, low B/l abd pain. Pt's abd is slightly distend and tender to touch. Pt bladder scanned 471m in bladder per scanner. Md made aware and placed orders for and In and out cath.  Pt in and out cath per protocol. Pt had 6036mout. Will continue to monitor pt.

## 2016-07-22 ENCOUNTER — Inpatient Hospital Stay (HOSPITAL_COMMUNITY): Payer: 59

## 2016-07-22 DIAGNOSIS — C3400 Malignant neoplasm of unspecified main bronchus: Secondary | ICD-10-CM

## 2016-07-22 DIAGNOSIS — Z01818 Encounter for other preprocedural examination: Secondary | ICD-10-CM

## 2016-07-22 DIAGNOSIS — J9601 Acute respiratory failure with hypoxia: Secondary | ICD-10-CM

## 2016-07-22 LAB — BASIC METABOLIC PANEL
ANION GAP: 8 (ref 5–15)
BUN: 7 mg/dL (ref 6–20)
CHLORIDE: 110 mmol/L (ref 101–111)
CO2: 23 mmol/L (ref 22–32)
Calcium: 8.9 mg/dL (ref 8.9–10.3)
Creatinine, Ser: 0.54 mg/dL (ref 0.44–1.00)
GFR calc Af Amer: >60 mL/min (ref >60)
Glucose, Bld: 114 mg/dL — ABNORMAL HIGH (ref 65–99)
POTASSIUM: 4.3 mmol/L (ref 3.5–5.1)
SODIUM: 141 mmol/L (ref 135–145)

## 2016-07-22 LAB — POCT I-STAT 3, ART BLOOD GAS (G3+)
ACID-BASE DEFICIT: 5 mmol/L — AB (ref 0.0–2.0)
ACID-BASE DEFICIT: 5 mmol/L — AB (ref 0.0–2.0)
BICARBONATE: 19.9 mmol/L — AB (ref 20.0–28.0)
Bicarbonate: 25.7 mmol/L (ref 20.0–28.0)
O2 SAT: 93 %
O2 Saturation: 94 %
PH ART: 7.375 (ref 7.350–7.450)
PO2 ART: 86 mmHg (ref 83.0–108.0)
PO2 ART: 74 mmHg — AB (ref 83.0–108.0)
Patient temperature: 97.5
Patient temperature: 99.2
TCO2: 28 mmol/L (ref 0–100)
TCO2: 21 mmol/L (ref 0–100)
pCO2 arterial: 76.1 mmHg (ref 32.0–48.0)
pCO2 arterial: 34.2 mmHg (ref 32.0–48.0)
pH, Arterial: 7.134 — CL (ref 7.350–7.450)

## 2016-07-22 LAB — GLUCOSE, CAPILLARY: Glucose-Capillary: 97 mg/dL (ref 65–99)

## 2016-07-22 LAB — CANCER ANTIGEN 19-9: CA 19 9: 64 U/mL — AB (ref 0–35)

## 2016-07-22 LAB — CBC
HEMATOCRIT: 42 % (ref 36.0–46.0)
HEMOGLOBIN: 13.9 g/dL (ref 12.0–15.0)
MCH: 30.8 pg (ref 26.0–34.0)
MCHC: 33.1 g/dL (ref 30.0–36.0)
MCV: 93.1 fL (ref 78.0–100.0)
Platelets: 355 10*3/uL (ref 150–400)
RBC: 4.51 MIL/uL (ref 3.87–5.11)
RDW: 12.9 % (ref 11.5–15.5)
WBC: 19.3 10*3/uL — AB (ref 4.0–10.5)

## 2016-07-22 LAB — LACTIC ACID, PLASMA
LACTIC ACID, VENOUS: 1.5 mmol/L (ref 0.5–1.9)
Lactic Acid, Venous: 1.4 mmol/L (ref 0.5–1.9)

## 2016-07-22 LAB — CA 125: CA 125: 31.4 U/mL (ref 0.0–38.1)

## 2016-07-22 LAB — CEA: CEA: 23.7 ng/mL — ABNORMAL HIGH (ref 0.0–4.7)

## 2016-07-22 MED ORDER — ROCURONIUM BROMIDE 50 MG/5ML IV SOLN
60.0000 mg | Freq: Once | INTRAVENOUS | Status: AC
  Administered 2016-07-22: 60 mg via INTRAVENOUS
  Filled 2016-07-22: qty 6

## 2016-07-22 MED ORDER — ETOMIDATE 2 MG/ML IV SOLN
20.0000 mg | Freq: Once | INTRAVENOUS | Status: AC
  Administered 2016-07-22: 20 mg via INTRAVENOUS

## 2016-07-22 MED ORDER — SODIUM CHLORIDE 0.9 % IV SOLN: INTRAVENOUS | Status: DC | PRN

## 2016-07-22 MED ORDER — FENTANYL BOLUS VIA INFUSION
50.0000 ug | INTRAVENOUS | Status: DC | PRN
  Administered 2016-07-23 (×2): 50 ug via INTRAVENOUS
  Filled 2016-07-22: qty 50

## 2016-07-22 MED ORDER — MIDAZOLAM BOLUS VIA INFUSION
1.0000 mg | INTRAVENOUS | Status: DC | PRN
  Filled 2016-07-22: qty 2

## 2016-07-22 MED ORDER — SODIUM CHLORIDE 0.9 % IV SOLN: INTRAVENOUS | Status: AC

## 2016-07-22 MED ORDER — LORAZEPAM 2 MG/ML IJ SOLN
1.0000 mg | INTRAMUSCULAR | Status: DC | PRN
  Administered 2016-07-22 (×2): 1 mg via INTRAVENOUS
  Filled 2016-07-22 (×2): qty 1

## 2016-07-22 MED ORDER — CHLORHEXIDINE GLUCONATE 0.12% ORAL RINSE (MEDLINE KIT)
15.0000 mL | Freq: Two times a day (BID) | OROMUCOSAL | Status: DC
  Administered 2016-07-22 – 2016-07-25 (×6): 15 mL via OROMUCOSAL

## 2016-07-22 MED ORDER — FENTANYL 2500MCG IN NS 250ML (10MCG/ML) PREMIX INFUSION
25.0000 ug/h | INTRAVENOUS | Status: DC
  Administered 2016-07-22: 50 ug/h via INTRAVENOUS
  Administered 2016-07-23: 200 ug/h via INTRAVENOUS
  Filled 2016-07-22: qty 250

## 2016-07-22 MED ORDER — ORAL CARE MOUTH RINSE
15.0000 mL | Freq: Two times a day (BID) | OROMUCOSAL | Status: DC
  Administered 2016-07-22 – 2016-07-25 (×7): 15 mL via OROMUCOSAL

## 2016-07-22 MED ORDER — SODIUM BICARBONATE 8.4 % IV SOLN
INTRAVENOUS | Status: AC
  Filled 2016-07-22: qty 50

## 2016-07-22 MED ORDER — ENOXAPARIN SODIUM 40 MG/0.4ML ~~LOC~~ SOLN
40.0000 mg | SUBCUTANEOUS | Status: DC
  Administered 2016-07-24 – 2016-07-25 (×2): 40 mg via SUBCUTANEOUS
  Filled 2016-07-22 (×2): qty 0.4

## 2016-07-22 MED ORDER — SODIUM BICARBONATE 8.4 % IV SOLN
50.0000 meq | Freq: Once | INTRAVENOUS | Status: AC
  Administered 2016-07-22: 50 meq via INTRAVENOUS

## 2016-07-22 MED ORDER — BISACODYL 10 MG RE SUPP: 10.0000 mg | Freq: Every day | RECTAL | Status: DC | PRN

## 2016-07-22 MED ORDER — SODIUM CHLORIDE 0.9 % IV SOLN
0.0000 mg/h | INTRAVENOUS | Status: DC
  Administered 2016-07-22: 1 mg/h via INTRAVENOUS
  Administered 2016-07-23: 2 mg/h via INTRAVENOUS
  Administered 2016-07-23: 3 mg/h via INTRAVENOUS
  Filled 2016-07-22 (×3): qty 10

## 2016-07-22 MED ORDER — FENTANYL CITRATE (PF) 100 MCG/2ML IJ SOLN: 50.0000 ug | Freq: Once | INTRAMUSCULAR | Status: DC

## 2016-07-22 MED ORDER — DOCUSATE SODIUM 50 MG/5ML PO LIQD: 100.0000 mg | Freq: Two times a day (BID) | ORAL | Status: DC | PRN

## 2016-07-22 MED ORDER — ORAL CARE MOUTH RINSE
15.0000 mL | Freq: Four times a day (QID) | OROMUCOSAL | Status: DC
  Administered 2016-07-23 – 2016-07-25 (×11): 15 mL via OROMUCOSAL

## 2016-07-22 MED ORDER — PANTOPRAZOLE SODIUM 40 MG IV SOLR
40.0000 mg | Freq: Every day | INTRAVENOUS | Status: DC
  Administered 2016-07-22 – 2016-07-25 (×4): 40 mg via INTRAVENOUS
  Filled 2016-07-22 (×4): qty 40

## 2016-07-22 MED ORDER — SODIUM CHLORIDE 0.9 % IV SOLN
0.0000 ug/min | INTRAVENOUS | Status: DC
  Administered 2016-07-23 (×3): 200 ug/min via INTRAVENOUS
  Administered 2016-07-23: 150 ug/min via INTRAVENOUS
  Administered 2016-07-23: 200 ug/min via INTRAVENOUS
  Administered 2016-07-23: 20 ug/min via INTRAVENOUS
  Administered 2016-07-23 (×3): 200 ug/min via INTRAVENOUS
  Administered 2016-07-23: 180 ug/min via INTRAVENOUS
  Filled 2016-07-22 (×9): qty 1

## 2016-07-22 MED ORDER — FENTANYL CITRATE (PF) 100 MCG/2ML IJ SOLN
INTRAMUSCULAR | Status: AC
  Administered 2016-07-22: 100 ug
  Filled 2016-07-22: qty 4

## 2016-07-22 MED ORDER — MIDAZOLAM HCL 2 MG/2ML IJ SOLN
INTRAMUSCULAR | Status: AC
  Administered 2016-07-22: 2 mg
  Filled 2016-07-22: qty 4

## 2016-07-22 NOTE — Procedures (Signed)
Arterial Catheter Insertion Procedure Note Felicia Long [id number] Oct 01, 1965  Procedure: Insertion of Arterial Catheter  Indications: Blood pressure monitoring  Procedure Details Consent: Unable to obtain consent because of altered level of consciousness. Time Out: Verified patient identification, verified procedure, site/side was marked, verified correct patient position, special equipment/implants available, medications/allergies/relevent history reviewed, required imaging and test results available.  Performed  Maximum sterile technique was used including antiseptics, cap, gloves, gown, hand hygiene, mask and sheet. Skin prep: Chlorhexidine; local anesthetic administered 20 gauge catheter was inserted into right radial artery using the Seldinger technique.  Evaluation Blood flow good; BP tracing good. Complications: No apparent complications.   Felicia Long 07/22/2016

## 2016-07-22 NOTE — Progress Notes (Signed)
Patient with increased work of breathing, tachycardia and  Elevated BP.  Ativan '1mg'$  administered respiratory notified breathing treatment administered and Bipap up to a hundred percent.  Dr. Wyline Copas notified and updated on patients change in condition.  Pulmonary notified awaiting MD to assess patient.  Dr. Wyline Copas at bedside.  Will continue to monitor.

## 2016-07-22 NOTE — Progress Notes (Signed)
[facility] Progress Note Patient Name: Felicia Long DOB: 1965/07/23 MRN: [id number]   Date of Service  07/22/2016  HPI/Events of Note  resp distress on BIPAP  eICU Interventions  On ground team to evaluate     Intervention Category Major Interventions: Respiratory failure - evaluation and management  Simonne Maffucci 07/22/2016, 4:24 PM

## 2016-07-22 NOTE — Progress Notes (Signed)
HEMATOLOGY-ONCOLOGY PROGRESS NOTE  SUBJECTIVE: Patient is resting on BiPAP machine Continued shortness of breath.  OBJECTIVE: REVIEW OF SYSTEMS:   Constitutional: Denies fevers, chills or abnormal weight loss Eyes: Denies blurriness of vision Ears, nose, mouth, throat, and face: Denies mucositis or sore throat Respiratory: Severe shortness of breath Cardiovascular: Denies palpitation, chest discomfort Gastrointestinal:  Denies nausea, heartburn or change in bowel habits Skin: Denies abnormal skin rashes Lymphatics: Denies new lymphadenopathy or easy bruising Neurological:Denies numbness, tingling or new weaknesses Extremities: No lower extremity edema All other systems were reviewed with the patient and are negative.  I have reviewed the past medical history, past surgical history, social history and family history with the patient and they are unchanged from previous note.   PHYSICAL EXAMINATION: ECOG PERFORMANCE STATUS: 2 - Symptomatic, <50% confined to bed  Vitals:   07/22/16 1157 07/22/16 1200  BP: (!) 141/88 136/88  Pulse: (!) 129 (!) 133  Resp: (!) 25 (!) 32  Temp:  (!) 96.6 F (35.9 C)   Filed Weights   07/21/16 0030  Weight: 105 lb 6.1 oz (47.8 kg)    GENERAL:alert, no distress and comfortable SKIN: skin color, texture, turgor are normal, no rashes or significant lesions EYES: normal, Conjunctiva are pink and non-injected, sclera clear OROPHARYNX:no exudate, no erythema and lips, buccal mucosa, and tongue normal  NECK: supple, thyroid normal size, non-tender, without nodularity LYMPH:  no palpable lymphadenopathy in the cervical, axillary or inguinal LUNGS: Difficult to auscultate because of the BiPAP machine HEART: regular rate & rhythm and no murmurs and no lower extremity edema ABDOMEN:abdomen soft, non-tender and normal bowel sounds Musculoskeletal:no cyanosis of digits and no clubbing  NEURO: alert & oriented x 3 with fluent speech, no focal motor/sensory  deficits  LABORATORY DATA:  I have reviewed the data as listed CMP Latest Ref Rng & Units 07/22/2016 07/21/2016 07/21/2016  Glucose 65 - 99 mg/dL 114(H) 104(H) 159(H)  BUN 6 - 20 mg/dL 7 5(L) <5(L)  Creatinine 0.44 - 1.00 mg/dL 0.54 0.46 0.73  Sodium 135 - 145 mmol/L 141 139 137  Potassium 3.5 - 5.1 mmol/L 4.3 4.6 2.8(L)  Chloride 101 - 111 mmol/L 110 109 105  CO2 22 - 32 mmol/L 23 23 19(L)  Calcium 8.9 - 10.3 mg/dL 8.9 8.3(L) 8.5(L)  Total Protein 6.5 - 8.1 g/dL - - 5.6(L)  Total Bilirubin 0.3 - 1.2 mg/dL - - 0.7  Alkaline Phos 38 - 126 U/L - - 62  AST 15 - 41 U/L - - 28  ALT 14 - 54 U/L - - 17    Lab Results  Component Value Date   WBC 19.3 (H) 07/22/2016   HGB 13.9 07/22/2016   HCT 42.0 07/22/2016   MCV 93.1 07/22/2016   PLT 355 07/22/2016   NEUTROABS 9.9 (H) 07/26/2016    ASSESSMENT AND PLAN: 1. Metastatic carcinoma with lung mass and liver metastases: We are hoping that she would have a liver biopsy tomorrow. If the cancer small cell lung cancer, then emergent chemotherapy will be given. If the cancer is non-small cell lung cancer, then she could potentially be treated with palliative intent radiation to prevent any hemoptysis related to the tumor that is wrapping around her pulmonary arteries.  called and discussed the case with Dr. Sondra Come who is willing to see the patient tomorrow to determine the plan.  We'll consult Dr. Sondra Come with Rad-Onc  Patient will also need a port placement. Until we have a tissue diagnosis, we cannot even consider treatment  options for her.

## 2016-07-22 NOTE — Procedures (Signed)
Intubation Procedure Note Felicia Long [id number] 09-Jan-1966  Procedure: Intubation Indications: Airway protection and maintenance  Procedure Details Consent: Risks of procedure as well as the alternatives and risks of each were explained to the (patient/caregiver).  Consent for procedure obtained. Time Out: Verified patient identification, verified procedure, site/side was marked, verified correct patient position, special equipment/implants available, medications/allergies/relevent history reviewed, required imaging and test results available.  Performed  Maximum sterile technique was used including cap, gloves, gown, hand hygiene and mask.  MAC and 3    Evaluation Hemodynamic Status: BP stable throughout; O2 sats: stable throughout Patient's Current Condition: stable Complications: No apparent complications Patient did tolerate procedure well. Chest X-ray ordered to verify placement.  CXR: tube position acceptable.   Felicia Long [facility] 07/22/2016

## 2016-07-22 NOTE — Procedures (Signed)
Intubation Procedure Note Felicia Long [id number] 01/03/1966  Procedure: Intubation Indications: Respiratory insufficiency  Procedure Details Consent: Risks of procedure as well as the alternatives and risks of each were explained to the (patient/caregiver).  Consent for procedure obtained. verbal consent from patient and family husband due to emergent medical need Time Out: Verified patient identification, verified procedure, site/side was marked, verified correct patient position, special equipment/implants available, medications/allergies/relevent history reviewed, required imaging and test results available.  Performed  Maximum sterile technique was used including cap, gloves, gown, hand hygiene and mask.  MAC  glidescope used - easu intibation following fentanyl 100, versed 58m, etomidate 215mand   Evaluation Hemodynamic Status: BP stable throughout; O2 sats: peristntly on low side pre and post intubation Patient's Current Condition: stable Complications: No apparent complications Patient did tolerate procedure well. Chest X-ray ordered to verify placement.  CXR: pending.  Procedure done by APP Tammy Parrret under my direct in room supervision . I did key portion of procedue of et tube insertion and holding glidescope to open mouth  Felicia Long 07/22/2016  Dr. MuBrand MalesM.D., F.[facility].P Pulmonary and Critical Care Medicine Staff Physician [facility] Pager: [phone] no answer or between  15:00h - 7:00h: call [phone]  07/22/2016 5:42 PM

## 2016-07-22 NOTE — Progress Notes (Signed)
Patient transported to room 2M13 from 4E14 without any apparent complications.

## 2016-07-22 NOTE — Consult Note (Signed)
Chief Complaint: Patient was seen in consultation today for liver lesion  Referring Physician(s):  Dr. Nicholas Lose  Supervising Physician: Jacqulynn Cadet  Patient Status: [facility] - In-pt  History of Present Illness: Felicia Long is a 51 y.o. female with past medical history of lung nodule who presented to [facility] ED with shortness of breath.   CT Chest 07/26/2016: 1. No definite acute pulmonary embolus or aortic dissection 2. Right hilar mass which is contiguous with consolidation and or mass in the anterior portion of the right upper lobe. There is tumor encasement of multiple right upper lobe pulmonary arterial branches. 3. Extensive mediastinal and hilar adenopathy, consistent with metastatic disease. Diffuse bilateral peribronchial soft tissue thickening. 4. Innumerable pulmonary nodules and masses, consistent with metastatic disease. 5. Multiple ill-defined masses within the liver, consistent with hepatic metastatic disease.  IR consulted for liver lesion biopsy and Port-A-Cath placement at the request of Dr. Lindi Adie.  Case reviewed by Dr. Laurence Ferrari who approves patient for procedure pending clinical status.   History reviewed. No pertinent past medical history.  Past Surgical History:  Procedure Laterality Date  . TUBAL LIGATION      Allergies: Patient has no known allergies.  Medications: Prior to Admission medications   Medication Sig Start Date End Date Taking? Authorizing Provider  acetaminophen (TYLENOL) 500 MG tablet Take 1,000 mg by mouth every 6 (six) hours as needed for mild pain.   Yes Historical Provider, MD  ibuprofen (ADVIL,MOTRIN) 200 MG tablet Take 800 mg by mouth every 6 (six) hours as needed for mild pain.   Yes Historical Provider, MD  temazepam (RESTORIL) 30 MG capsule Take 30 mg by mouth at bedtime as needed for sleep. 07/11/16  Yes Historical Provider, MD     Family History  Problem Relation Age of Onset  . Breast cancer Mother   . Lung cancer  Other   . CAD Other     Social History   Social History  . Marital status: Married    Spouse name: N/A  . Number of children: N/A  . Years of education: N/A   Social History Main Topics  . Smoking status: Former Smoker    Types: Cigarettes    Quit date: 07/09/2016  . Smokeless tobacco: Never Used  . Alcohol use No     Comment: quit 1 month  . Drug use: No  . Sexual activity: Not Asked   Other Topics Concern  . None   Social History Narrative  . None    Review of Systems  Constitutional: Negative for fatigue and fever.  Respiratory: Positive for shortness of breath. Negative for cough.   Cardiovascular: Negative for chest pain.  Gastrointestinal: Positive for abdominal pain.  Psychiatric/Behavioral: Negative for behavioral problems and confusion.    Vital Signs: BP (!) 141/88   Pulse (!) 131   Temp 99 F (37.2 C) (Axillary)   Resp (!) 28   Ht 5' 2"  (1.575 m)   Wt 105 lb 6.1 oz (47.8 kg)   LMP 07/06/2016 (Approximate)   SpO2 91%   BMI 19.27 kg/m   Physical Exam  Constitutional: She is oriented to person, place, and time. She appears well-developed.  Cardiovascular: Regular rhythm and normal heart sounds.   tachycardia  Pulmonary/Chest: She is in respiratory distress. She has wheezes. She has rales.  On bi-pap  Abdominal: Soft.  Neurological: She is alert and oriented to person, place, and time.  Skin: Skin is warm and dry.  Psychiatric: She has a  normal mood and affect. Her behavior is normal. Judgment and thought content normal.  Nursing note and vitals reviewed.   Mallampati Score:  MD Evaluation Airway: Other (comments) Airway comments: on bi-pap Heart: WNL Abdomen: WNL Chest/ Lungs: WNL ASA  Classification: 3 Mallampati/Airway Score: Two  Imaging: Dg Chest 2 View  Result Date: 07/18/2016 CLINICAL DATA:  Severe shortness of breath. Nonproductive cough. Recent PET scan that demonstrated probable primary malignancy of the lung as well as  pulmonary and hepatic metastases as well as extensive adenopathy in the chest, abdomen, and pelvis. EXAM: CHEST  2 VIEW COMPARISON:  Report of PET-CT scan dated 07/17/2016 FINDINGS: The patient has a 4.3 cm right hilar mass as well as innumerable metastatic nodules in both lungs. There are no consolidative infiltrates or effusions. The heart size is normal. Bones appear normal. IMPRESSION: Right perihilar lung mass. Innumerable metastatic lesions throughout both lungs. Electronically Signed   By: Lorriane Shire M.D.   On: 08/03/2016 16:37   Ct Angio Chest Pe W And/or Wo Contrast  Result Date: 07/29/2016 CLINICAL DATA:  Shortness of breath with dry cough for 2 months EXAM: CT ANGIOGRAPHY CHEST WITH CONTRAST TECHNIQUE: Multidetector CT imaging of the chest was performed using the standard protocol during bolus administration of intravenous contrast. Multiplanar CT image reconstructions and MIPs were obtained to evaluate the vascular anatomy. CONTRAST:  100 cc Isovue 370 intravenous COMPARISON:  Chest x-ray 07/18/2016 FINDINGS: Cardiovascular: Satisfactory opacification of the pulmonary arteries to the segmental level. No evidence of pulmonary embolism. Encasement and significant narrowing of multiple right upper lobe pulmonary artery branch vessels. Non aneurysmal aorta. No dissection. Normal heart size. No pericardial effusion. Mediastinum/Nodes: Extensive mediastinal adenopathy. Nodal mass anterior to the trachea measures 1.8 x 2 cm. Matted adenopathy within the AP window and sub- carinal space. Subcarinal soft tissue mass measures 2.6 cm. Multiple enlarged bilateral hilar nodes. Suspected right hilar which is contiguous with consolidation an or mass in the anterior right upper lobe, this measures 5.6 cm AP by 6.7 cm oblique axial. Trachea midline. Diffuse peribronchial soft tissue mass an or thickening. Esophagus is nonenlarged. Lungs/Pleura: Innumerable pulmonary masses and nodules. No pleural effusion. Focal  consolidative process an or tumor in the right upper lobe contiguous with right hilar soft tissue mass. Upper Abdomen: Multiple ill-defined masses within the liver, the largest is seen within the left hepatic lobe and measures at least 7.1 cm. Imaged adrenal glands, spleen, and upper kidneys are within normal limits. Imaged pancreas normal. Musculoskeletal: No acute or suspicious bone lesion Review of the MIP images confirms the above findings. IMPRESSION: 1. No definite acute pulmonary embolus or aortic dissection 2. Right hilar mass which is contiguous with consolidation and or mass in the anterior portion of the right upper lobe. There is tumor encasement of multiple right upper lobe pulmonary arterial branches. 3. Extensive mediastinal and hilar adenopathy, consistent with metastatic disease. Diffuse bilateral peribronchial soft tissue thickening. 4. Innumerable pulmonary nodules and masses, consistent with metastatic disease. 5. Multiple ill-defined masses within the liver, consistent with hepatic metastatic disease. Electronically Signed   By: Donavan Foil M.D.   On: 07/13/2016 18:16    Labs:  CBC:  Recent Labs  07/10/2016 1354 07/21/16 0242 07/22/16 0316  WBC 13.6* 18.4* 19.3*  HGB 14.0 12.4 13.9  HCT 40.3 38.2 42.0  PLT 476* 344 355    COAGS:  Recent Labs  07/24/2016 1543 07/21/16 0242  INR 1.04 1.14  APTT  --  31    BMP:  Recent Labs  07/28/2016 1354 07/21/16 0242 07/21/16 1534 07/22/16 0316  NA 131* 137 139 141  K 3.5 2.8* 4.6 4.3  CL 101 105 109 110  CO2 20* 19* 23 23  GLUCOSE 130* 159* 104* 114*  BUN <5* <5* 5* 7  CALCIUM 9.5 8.5* 8.3* 8.9  CREATININE 0.50 0.73 0.46 0.54  GFRNONAA >60 >60 >60 >60  GFRAA >60 >60 >60 >60    LIVER FUNCTION TESTS:  Recent Labs  07/09/2016 1354 07/21/16 0242  BILITOT 0.5 0.7  AST 27 28  ALT 19 17  ALKPHOS 73 62  PROT 6.8 5.6*  ALBUMIN 3.4* 2.7*    TUMOR MARKERS: No results for input(s): AFPTM, CEA, CA199, CHROMGRNA in  the last 8760 hours.  Assessment and Plan: Patient with recent CT findings of widespread metastatic disease presented to [facility] with respiratory distress.  IR consulted for liver lesion biopsy as well as Port-A-Cath placement for possible chemotherapy at the request of Dr. Lindi Adie.  Palliative care team has met with patient to discuss care plan in the setting of advanced disease with acute illness.  Patient and family would like to pursue biopsy and Port placement if possible.  PA met with patient and husband.  Discussed expectations for timing of procedures in the setting of her acute respiratory illness.  Risks and benefits discussed with the patient including, but not limited to bleeding, infection, damage to adjacent structures or low yield requiring additional tests. All of the patient's questions were answered, patient is agreeable to proceed. Consent signed and in chart. Risks and benefits discussed with the patient including, but not limited to bleeding, infection, pneumothorax, or fibrin sheath development and need for additional procedures. All of the patient's questions were answered, patient is agreeable to proceed. Consent signed and in chart. Will reassess patient tomorrow for stability prior to proceeding with requested procedures.  NPO order currently in place due to bi-pap.  Thank you for this interesting consult.  I greatly enjoyed meeting Felicia Long and look forward to participating in their care.  A copy of this report was sent to the requesting provider on this date.  Electronically Signed: Docia Barrier 07/22/2016, 11:13 AM   I spent a total of 40 Minutes    in face to face in clinical consultation, greater than 50% of which was counseling/coordinating care for liver lesion.

## 2016-07-22 NOTE — Progress Notes (Signed)
   abg 6:49 PM 7.12/78/90 - 100%, peep 5 and RR 16  Plan -  ards protocol  = increase RR to 30 - insert art line  - recheck abg in 1h - call box elink with result  Dr. Brand Males, M.D., [facility].C.P Pulmonary and Critical Care Medicine Staff Physician [place] [facility] Pager: [phone], If no answer or between  15:00h - 7:00h: call [phone]  07/22/2016 6:50 PM

## 2016-07-22 NOTE — Progress Notes (Signed)
PULMONARY / CRITICAL CARE MEDICINE   Name: Felicia Long MRN: [id number] DOB: 06-08-1965    ADMISSION DATE:  07/27/2016 CONSULTATION DATE:  07/12/2016   REFERRING MD:  Dr. Roel Cluck  CHIEF COMPLAINT:  Shortness of breath and increased work of breathing  HISTORY OF PRESENT ILLNESS:   Felicia Long is a 51 y/o long time smoker (quit about 2 weeks ago) who was recently evaluated at [facility] for lung nodules found on a CT scan obtained for shortness of breath.  She has undergone a PET CT which shows multiple FDG avid bilateral pulmonary nodules with a large right sided hilar mass.  She was also found to have multiple liver lesions and FDG avid abdominal and thoracic lymph nodes. She came to the [facility] ED with worsening shortness of breath and was found to have O2 saturations in the mid 80s on RA.  A CTA was done to rule out PE which confirmed her multiple pulmonary nodules but did not show PE.  She was wheezing on admission and was given multiple albuterol treatments and 1 hour of continuous albuterol.  Upon admission her O2 saturations improved on 3L [place], however when she arrived on 4E she was noted to have significantly increased WOB, tachypnea and tachycardia.  She was placed on BiPap with improvement in her O2 sats as well as her WOB.  Her ABG on 3L [place] was 7.44/26/71 with a serum CO2 of 20.  Pulmonary was consulted due to her shortness of breath and possible need for a higher level of care.    SUBJECTIVE:  Called to room for emergent evaluation for increased wob on BIPAP  Pt with ++accessory use, tachycardia and increased wob despite BIPAP .  Husband and daughters at bedside with update .  Conversation regarding need for transfer to ICU with urgent intubation - they agree  Pt/husband says they are okay with intubation but do not want prolonged vent support if prognosis is poor.    VITAL SIGNS: BP (!) 146/102   Pulse (!) 138   Temp (!) 96.6 F (35.9 C) (Axillary)   Resp (!) 35   Ht '5\' 2"'$  (1.575 m)   Wt 105  lb 6.1 oz (47.8 kg)   LMP 07/06/2016 (Approximate)   SpO2 92%   BMI 19.27 kg/m   HEMODYNAMICS:    VENTILATOR SETTINGS: FiO2 (%):  [65 %-100 %] 100 %  INTAKE / OUTPUT: I/O last 3 completed shifts: In: 3626.7 [I.V.:961.7; IV Piggyback:2665] Out: 3075 [Urine:3075]  PHYSICAL EXAMINATION: GEN : +resp distress on BIPAP with increased wob  Neuro:  A/O x 3 , trying to answer questions  HEENT : dry mucosa, on BIPAP  CARD : ST , no m/r/g Lungs : Exp wheezing , decreased aeration bilaterally , + accessory use  ABD : Soft NT , BS +  Musculoskeletal: intact,  Skin : no rashes , acrylic nails  LABS:  BMET  Recent Labs Lab 07/21/16 0242 07/21/16 1534 07/22/16 0316  NA 137 139 141  K 2.8* 4.6 4.3  CL 105 109 110  CO2 19* 23 23  BUN <5* 5* 7  CREATININE 0.73 0.46 0.54  GLUCOSE 159* 104* 114*    Electrolytes  Recent Labs Lab 07/21/16 0242 07/21/16 1534 07/22/16 0316  CALCIUM 8.5* 8.3* 8.9  MG 1.5* 2.2  --   PHOS 2.8  --   --     CBC  Recent Labs Lab 07/15/2016 1354 07/21/16 0242 07/22/16 0316  WBC 13.6* 18.4* 19.3*  HGB 14.0 12.4 13.9  HCT 40.3 38.2 42.0  PLT 476* 344 355    Coag's  Recent Labs Lab 07/19/2016 1543 07/21/16 0242  APTT  --  31  INR 1.04 1.14    Sepsis Markers  Recent Labs Lab 07/07/2016 2305 07/21/16 0242 07/22/16 1002 07/22/16 1254  LATICACIDVEN 3.0* 6.2* 1.4 1.5  PROCALCITON 0.19  --   --   --     ABG  Recent Labs Lab 07/31/2016 2202  PHART 7.442  PCO2ART 25.9*  PO2ART 71.0*    Liver Enzymes  Recent Labs Lab 07/31/2016 1354 07/21/16 0242  AST 27 28  ALT 19 17  ALKPHOS 73 62  BILITOT 0.5 0.7  ALBUMIN 3.4* 2.7*    Cardiac Enzymes  Recent Labs Lab 07/27/2016 1543  TROPONINI <0.03    Glucose No results for input(s): GLUCAP in the last 168 hours.  Imaging No results found.   STUDIES:  CTA - 07/16/2016 - Right hilar mass with innumerable pulmonary nodules, extensive adenopathy. Liver masses ? Mets    CULTURES: Blood - 07/06/2016  ANTIBIOTICS: Vanc 07/29/2016 --> Zosyn 07/22/2016 -->  SIGNIFICANT EVENTS: BiPap started 08/03/2016   LINES/TUBES:  I have reviewed CT- very extensive nodularity cw extensive mets  DISCUSSION: 51 y/o woman with likely widely metastatic lung cancer presented with worsening shortness of breath.  Improved with BiPap however low threshold for transfer to ICU if worsens.  ASSESSMENT / PLAN:  PULMONARY A: Lung mass with extensive pulmonary nodules /adenopathy w/ liver masses c/w Metastatic Lung Cancer -not biopsy proven.   Acute Respiratory Failure   P:   Transfer to ICU , prepare for intubation  Vent support  VAP precautions  Daily evaluate for SBT/Wean  Consider diagnostic workup /Bx options once stablized.  Check ABG in 1 hr  Check CXR post intubation  Cont on duoneb q 4h   CARDIOVASCULAR A:  Tachycardia -BNP nml . Troponin neg . Lactate clearing  P:  Continue to monitor  PICC line ordered  Metoprolol As needed    RENAL A:   Hyponatremia Lactic acidosis P:   Tr Lactate  Cont IVF NS at 75   INFECTIOUS A:   Possible pna P:   vanc/zosyn  pending blood culture results  NEUROLOGIC A:   At risk for brain mets P:   Will need brain MRI to r/o brain mets Sedation protocol    FAMILY  - Updates: Husband and daughter updated 3/18   Tammy Parrett NP-C   Pulmonary and Critical Care  [phone]   07/22/2016 1630

## 2016-07-22 NOTE — Progress Notes (Signed)
Patient placed back on bi-pap. RT will monitor patient.

## 2016-07-22 NOTE — Progress Notes (Signed)
[facility] Progress Note Patient Name: Corda Shutt DOB: [birth year] MRN: [id number]  / Date of Service  07/22/2016  HPI/Events of Note  Hypotension - BP = 88/47 (MAP = 56) and HR = 151,   eICU Interventions  Will order: 1. Phenylephrine IV infusion. Titrate to MAP >= 65,     Intervention Category Major Interventions: Hypotension - evaluation and management  [name] 07/22/2016, 11:48 PM

## 2016-07-22 NOTE — Progress Notes (Signed)
Notified RN on [facility] that PICC might not be done tonight.  WIll call back later to confirm.  Pt not on [facility] at this time- RN has just received report.

## 2016-07-22 NOTE — Progress Notes (Signed)
PULMONARY / CRITICAL CARE MEDICINE   Name: Felicia Long MRN: [id number] DOB: 10/18/1965    ADMISSION DATE:  07/16/2016 CONSULTATION DATE:  07/07/2016   REFERRING MD:  Dr. Roel Cluck  CHIEF COMPLAINT:  Shortness of breath and increased work of breathing  HISTORY OF PRESENT ILLNESS:   Felicia Long is a 51 y/o long time smoker (quit about 2 weeks ago) who was recently evaluated at [facility] for lung nodules found on a CT scan obtained for shortness of breath.  She has undergone a PET CT which shows multiple FDG avid bilateral pulmonary nodules with a large right sided hilar mass.  She was also found to have multiple liver lesions and FDG avid abdominal and thoracic lymph nodes. She came to the [facility] ED with worsening shortness of breath and was found to have O2 saturations in the mid 80s on RA.  A CTA was done to rule out PE which confirmed her multiple pulmonary nodules but did not show PE.  She was wheezing on admission and was given multiple albuterol treatments and 1 hour of continuous albuterol.  Upon admission her O2 saturations improved on 3L [place], however when she arrived on 4E she was noted to have significantly increased WOB, tachypnea and tachycardia.  She was placed on BiPap with improvement in her O2 sats as well as her WOB.  Her ABG on 3L [place] was 7.44/26/71 with a serum CO2 of 20.  Pulmonary was consulted due to her shortness of breath and possible need for a higher level of care.    No Known Allergies   REVIEW OF SYSTEMS:   She has had weight loss over the last month, she denies fever, chills or productive cough.  She has had a dry cough for the last month.   SUBJECTIVE:  Indicates not dyspneic on BIPAP, but did not tolerate trial on 10 L prongs this morning. Tired of BIPAP mask.   VITAL SIGNS: BP 132/84 (BP Location: Right Arm)   Pulse (!) 114   Temp 99 F (37.2 C) (Axillary)   Resp (!) 33   Ht '5\' 2"'$  (1.575 m)   Wt 105 lb 6.1 oz (47.8 kg)   LMP 07/06/2016 (Approximate)   SpO2 93%   BMI  19.27 kg/m   HEMODYNAMICS:    VENTILATOR SETTINGS: FiO2 (%):  [60 %-80 %] 75 %  INTAKE / OUTPUT: I/O last 3 completed shifts: In: 3626.7 [I.V.:961.7; IV XQJJHERDE:0814] Out: 3075 [Urine:3075]  PHYSICAL EXAMINATION: General:  Lying in bed, BiPap in place, answering questions and giving thumbs up Neuro:  Alert and oriented x4 HEENT:  PERRL, EOMI, Bipap mask in place Cardiovascular:  Tachycardic, regular rhythm, no MRG Lungs:  Faint expiratory wheezes in bilateral upper lobes, relatively clear otherwise Abdomen:  Soft, non-tender, no palpable HSM, +BS Musculoskeletal:  No joint abnormalities, wearing acrylic nails, ?clubbing of toes, slightly prolonged capillary refill Skin:  No rashes or other lesions  LABS:  BMET  Recent Labs Lab 07/21/16 0242 07/21/16 1534 07/22/16 0316  NA 137 139 141  K 2.8* 4.6 4.3  CL 105 109 110  CO2 19* 23 23  BUN <5* 5* 7  CREATININE 0.73 0.46 0.54  GLUCOSE 159* 104* 114*    Electrolytes  Recent Labs Lab 07/21/16 0242 07/21/16 1534 07/22/16 0316  CALCIUM 8.5* 8.3* 8.9  MG 1.5* 2.2  --   PHOS 2.8  --   --     CBC  Recent Labs Lab 07/22/2016 1354 07/21/16 0242 07/22/16 0316  WBC 13.6*  18.4* 19.3*  HGB 14.0 12.4 13.9  HCT 40.3 38.2 42.0  PLT 476* 344 355    Coag's  Recent Labs Lab 07/22/2016 1543 07/21/16 0242  APTT  --  31  INR 1.04 1.14    Sepsis Markers  Recent Labs Lab 07/15/2016 1817 07/10/2016 2305 07/21/16 0242  LATICACIDVEN 2.0* 3.0* 6.2*  PROCALCITON  --  0.19  --     ABG  Recent Labs Lab 07/18/2016 2202  PHART 7.442  PCO2ART 25.9*  PO2ART 71.0*    Liver Enzymes  Recent Labs Lab 07/31/2016 1354 07/21/16 0242  AST 27 28  ALT 19 17  ALKPHOS 73 62  BILITOT 0.5 0.7  ALBUMIN 3.4* 2.7*    Cardiac Enzymes  Recent Labs Lab 07/15/2016 1543  TROPONINI <0.03    Glucose No results for input(s): GLUCAP in the last 168 hours.  Imaging No results found.   STUDIES:  CTA - 08/01/2016 - Right  hilar mass with innumerable pulmonary nodules  CULTURES: Blood - 07/14/2016  ANTIBIOTICS: Vanc 07/11/2016 --> Zosyn 08/04/2016 -->  SIGNIFICANT EVENTS: BiPap started 08/04/2016   LINES/TUBES:  I have reviewed CT- very extensive nodularity cw extensive mets  DISCUSSION: 51 y/o woman with likely widely metastatic lung cancer presented with worsening shortness of breath.  Improved with BiPap however low threshold for transfer to ICU if worsens.  ASSESSMENT / PLAN:  PULMONARY A: Dyspnea P:   Worsening dyspnea after admission - unclear etiology - ? Pulmonary edema after fluid administration, ?V/Q mismatch after albuterol administration, ?significant tachycardia 2/2 to albuterol causing worsening anxiety and dyspnea? WBC up on decadron Discussed w RT. Will try non-rebreather or venti mask as alternatives to confining BIPAP. Watch for CO2 retention. Awaiting liver bx per oncology for definite dx.  CARDIOVASCULAR A:  tachycardia P:    Appears to be sinus tach.  Will monitor.  RENAL A:   Hyponatremia Lactic acidosis P:   She doesn't look toxic- will repeat lactic acid   INFECTIOUS A:   Possible pna P:   vanc/zosyn pending blood culture results  NEUROLOGIC A:   At risk for brain mets P:   Will need brain MRI to r/o brain mets   FAMILY  - Updates:  Family at bedside and updated    CD Young, MD Pulmonary and [place]     P [phone]       After 3:00 PM Pager: [phone]  07/22/2016, 9:43 AM

## 2016-07-22 NOTE — Progress Notes (Signed)
Pt. Transferred 2M13 report given at bedside to receiving nurse, all questions answered at this time.

## 2016-07-22 NOTE — Progress Notes (Signed)
PROGRESS NOTE    Felicia Long  HCW:[id number] DOB: [birth year] DOA: 07/31/2016 PCP: Elliot Dally, MD    Brief Narrative:  51 y.o. female with medical history significant of a diagnosed lung mass and rectal mass with likely metastatic widespread    Presented with worsening dyspnea and shortness of breath and associated chest pain worsen her baseline. She can hardly walk without severe dyspnea which improved with rest. No leg swelling or calf pain.  Patient was found to have new diagnosis of diffuse lung masses as well as masses to her colon and liver few weeks ago. She was seen on 26 over every by her PCP with 2 weeks of cough and worsening shortness of breath she had denied any fevers or chills. D-dimer was elevated CT angiogram was done showing multiple lung masses and she was seen by pulmonology on March 2nd was told that CT scan was worrisome for malignancy due to metastases ordered stat PET scan Patient quit smoking only recently. 15 pound weight loss over past 6 weeks unintentional reports mild hematochezia and constipation no hemoptysis. Denies fevers or chills Denies any prior malignancy history area reports family history of breast cancer never had a mammogram or colonoscopy.  Patient used to drink heavily but all now will have cut down and drinks socially only.  Assessment & Plan:   Active Problems:   Lung cancer ([facility])   Acute respiratory failure with hypoxia ([facility])   Leukocytosis   Respiratory distress   Tachycardia   Dehydration   SIRS (systemic inflammatory response syndrome) ([place])   1. Acute hypoxemic respiratory failure 1. Patient currently BiPAP dependent 2. PCCM was consulted at time of admission 3. Continue supplemental O2 as needed. Weaning O2 as tolerated 4. Suspect secondary to combination of metastatic lung cancer with PNA, suspect post-obstructive 5. Pulmonary plans for weaning off bipap as tolerated 2. Likely metastatic lung cancer 1. Recent chest CT  personally reviewed 2. Innumerable metastatic lesions noted in bilateral lungs as well as in liver 3. Pt also noted to have rectal masses 4. Oncology consulted and is following. Recommendations for IR guided CT biopsy of liver with anticipation for port placement for palliative chemo 5. Overall poor prognosis. Less than 87mo prognosis per Palliative Care 6. Palliative Care on board. Appreciate input 3. Pneumonia with sepsis present on admission 1. Patient is continued on zosyn monotherapy 2. MRSA swab is negative, therefore off vanc 3. Pulmonary following 4. Leukocytosis 1. Suspect secondary to PNA vs metastatic disease 2. Leukocytosis stable 3. Repeat CBC in AM 5. Tachycardia 1. Suspect related to presenting PNA with metastatic disease 2. Improved with scheduled beta blocker 3. Stable at present 6. Hx tobacco abuse 1. Patient reportedly quit smoking two weeks prior to admission 2. Stable at present 7. Hx ETOH abuse 1. Prior heavy ETOH intake 2. Patient states quitting two weeks piror to admission 8. Hypokalemia 1. Replaced  2. Will repeat bmet in AM 9. Hypomagnesemia 1. Replaced 2. Now within normal limits  DVT prophylaxis: Lovenox subQ Code Status: Full Family Communication: Pt in room, pt's daughter at bedside Disposition Plan: Uncertain at this time  Consultants:   PCCM  Oncology  Palliative Care  Procedures:     Antimicrobials: Anti-infectives    Start     Dose/Rate Route Frequency Ordered Stop   07/14/2016 2245  piperacillin-tazobactam (ZOSYN) IVPB 3.375 g     3.375 g 12.5 mL/hr over 240 Minutes Intravenous Every 8 hours 08/02/2016 2232     08/02/2016 2245  vancomycin (  VANCOCIN) 500 mg in sodium chloride 0.9 % 100 mL IVPB  Status:  Discontinued     500 mg 100 mL/hr over 60 Minutes Intravenous Every 12 hours 07/23/2016 2232 07/21/16 1428      Subjective: [place], without complaints. Reports breathing is somewhat better  Objective: Vitals:   07/22/16 1100  07/22/16 1157 07/22/16 1200 07/22/16 1611  BP: (!) 141/88 (!) 141/88 136/88 (!) 146/102  Pulse: (!) 131 (!) 129 (!) 133 (!) 138  Resp: (!) 28 (!) 25 (!) 32 (!) 35  Temp:   (!) 96.6 F (35.9 C)   TempSrc:   Axillary   SpO2: 91% 91% 90% 92%  Weight:      Height:        Intake/Output Summary (Last 24 hours) at 07/22/16 1613 Last data filed at 07/22/16 1228  Gross per 24 hour  Intake              325 ml  Output             1850 ml  Net            -1525 ml   Filed Weights   07/21/16 0030  Weight: 47.8 kg (105 lb 6.1 oz)    Examination:  General exam: Awake, laying in bed, in nad Respiratory system: mildly increased resp effort, coarse breath sounds Cardiovascular system: tachycardic, s1-2 Gastrointestinal system: soft, nondistended, pos bs Central nervous system: cn2-12 grossly intact, strength intact Extremities: no clubbing, no cyanosis Skin: Normal skin turgor, no notable skin lesions seen Psychiatry: mood normal// no visual hallucinations  Data Reviewed: I have personally reviewed following labs and imaging studies  CBC:  Recent Labs Lab 07/27/2016 1354 07/21/16 0242 07/22/16 0316  WBC 13.6* 18.4* 19.3*  NEUTROABS 9.9*  --   --   HGB 14.0 12.4 13.9  HCT 40.3 38.2 42.0  MCV 89.2 91.6 93.1  PLT 476* 344 962   Basic Metabolic Panel:  Recent Labs Lab 07/09/2016 1354 07/21/16 0242 07/21/16 1534 07/22/16 0316  NA 131* 137 139 141  K 3.5 2.8* 4.6 4.3  CL 101 105 109 110  CO2 20* 19* 23 23  GLUCOSE 130* 159* 104* 114*  BUN <5* <5* 5* 7  CREATININE 0.50 0.73 0.46 0.54  CALCIUM 9.5 8.5* 8.3* 8.9  MG  --  1.5* 2.2  --   PHOS  --  2.8  --   --    GFR: Estimated Creatinine Clearance: 64.9 mL/min (by C-G formula based on SCr of 0.54 mg/dL). Liver Function Tests:  Recent Labs Lab 07/15/2016 1354 07/21/16 0242  AST 27 28  ALT 19 17  ALKPHOS 73 62  BILITOT 0.5 0.7  PROT 6.8 5.6*  ALBUMIN 3.4* 2.7*   No results for input(s): LIPASE, AMYLASE in the last 168  hours. No results for input(s): AMMONIA in the last 168 hours. Coagulation Profile:  Recent Labs Lab 08/01/2016 1543 07/21/16 0242  INR 1.04 1.14   Cardiac Enzymes:  Recent Labs Lab 07/31/2016 1543  TROPONINI <0.03   BNP (last 3 results) No results for input(s): PROBNP in the last 8760 hours. HbA1C: No results for input(s): HGBA1C in the last 72 hours. CBG: No results for input(s): GLUCAP in the last 168 hours. Lipid Profile: No results for input(s): CHOL, HDL, LDLCALC, TRIG, CHOLHDL, LDLDIRECT in the last 72 hours. Thyroid Function Tests:  Recent Labs  07/21/16 0242  TSH 3.319   Anemia Panel: No results for input(s): VITAMINB12, FOLATE, FERRITIN, TIBC, IRON, RETICCTPCT  in the last 72 hours. Sepsis Labs:  Recent Labs Lab 07/19/2016 2305 07/21/16 0242 07/22/16 1002 07/22/16 1254  PROCALCITON 0.19  --   --   --   LATICACIDVEN 3.0* 6.2* 1.4 1.5    Recent Results (from the past 240 hour(s))  MRSA PCR Screening     Status: None   Collection Time: 08/04/2016 10:50 PM  Result Value Ref Range Status   MRSA by PCR NEGATIVE NEGATIVE Final    Comment:        The GeneXpert MRSA Assay (FDA approved for NASAL specimens only), is one component of a comprehensive MRSA colonization surveillance program. It is not intended to diagnose MRSA infection nor to guide or monitor treatment for MRSA infections.   Culture, blood (x 2)     Status: None (Preliminary result)   Collection Time: 07/06/2016 11:10 PM  Result Value Ref Range Status   Specimen Description BLOOD RIGHT WRIST  Final   Special Requests AEROBIC BOTTLE ONLY 6ML  Final   Culture NO GROWTH 1 DAY  Final   Report Status PENDING  Incomplete  Culture, blood (x 2)     Status: None (Preliminary result)   Collection Time: 07/19/2016 11:16 PM  Result Value Ref Range Status   Specimen Description BLOOD LEFT ARM  Final   Special Requests BOTTLES DRAWN AEROBIC AND ANAEROBIC 5ML  Final   Culture NO GROWTH 1 DAY  Final    Report Status PENDING  Incomplete     Radiology Studies: Dg Chest 2 View  Result Date: 07/12/2016 CLINICAL DATA:  Severe shortness of breath. Nonproductive cough. Recent PET scan that demonstrated probable primary malignancy of the lung as well as pulmonary and hepatic metastases as well as extensive adenopathy in the chest, abdomen, and pelvis. EXAM: CHEST  2 VIEW COMPARISON:  Report of PET-CT scan dated 07/17/2016 FINDINGS: The patient has a 4.3 cm right hilar mass as well as innumerable metastatic nodules in both lungs. There are no consolidative infiltrates or effusions. The heart size is normal. Bones appear normal. IMPRESSION: Right perihilar lung mass. Innumerable metastatic lesions throughout both lungs. Electronically Signed   By: Lorriane Shire M.D.   On: 08/04/2016 16:37   Ct Angio Chest Pe W And/or Wo Contrast  Result Date: 07/26/2016 CLINICAL DATA:  Shortness of breath with dry cough for 2 months EXAM: CT ANGIOGRAPHY CHEST WITH CONTRAST TECHNIQUE: Multidetector CT imaging of the chest was performed using the standard protocol during bolus administration of intravenous contrast. Multiplanar CT image reconstructions and MIPs were obtained to evaluate the vascular anatomy. CONTRAST:  100 cc Isovue 370 intravenous COMPARISON:  Chest x-ray 07/13/2016 FINDINGS: Cardiovascular: Satisfactory opacification of the pulmonary arteries to the segmental level. No evidence of pulmonary embolism. Encasement and significant narrowing of multiple right upper lobe pulmonary artery branch vessels. Non aneurysmal aorta. No dissection. Normal heart size. No pericardial effusion. Mediastinum/Nodes: Extensive mediastinal adenopathy. Nodal mass anterior to the trachea measures 1.8 x 2 cm. Matted adenopathy within the AP window and sub- carinal space. Subcarinal soft tissue mass measures 2.6 cm. Multiple enlarged bilateral hilar nodes. Suspected right hilar which is contiguous with consolidation an or mass in the  anterior right upper lobe, this measures 5.6 cm AP by 6.7 cm oblique axial. Trachea midline. Diffuse peribronchial soft tissue mass an or thickening. Esophagus is nonenlarged. Lungs/Pleura: Innumerable pulmonary masses and nodules. No pleural effusion. Focal consolidative process an or tumor in the right upper lobe contiguous with right hilar soft tissue mass. Upper Abdomen:  Multiple ill-defined masses within the liver, the largest is seen within the left hepatic lobe and measures at least 7.1 cm. Imaged adrenal glands, spleen, and upper kidneys are within normal limits. Imaged pancreas normal. Musculoskeletal: No acute or suspicious bone lesion Review of the MIP images confirms the above findings. IMPRESSION: 1. No definite acute pulmonary embolus or aortic dissection 2. Right hilar mass which is contiguous with consolidation and or mass in the anterior portion of the right upper lobe. There is tumor encasement of multiple right upper lobe pulmonary arterial branches. 3. Extensive mediastinal and hilar adenopathy, consistent with metastatic disease. Diffuse bilateral peribronchial soft tissue thickening. 4. Innumerable pulmonary nodules and masses, consistent with metastatic disease. 5. Multiple ill-defined masses within the liver, consistent with hepatic metastatic disease. Electronically Signed   By: Donavan Foil M.D.   On: 07/17/2016 18:16    Scheduled Meds: . acetaminophen  650 mg Oral TID  . dexamethasone  10 mg Intravenous Q24H  . [START ON 07/24/2016] enoxaparin (LOVENOX) injection  40 mg Subcutaneous Q24H  . Influenza vac split quadrivalent PF  0.5 mL Intramuscular Tomorrow-1000  . ipratropium-albuterol  3 mL Nebulization Q4H  . levalbuterol  1.89 mg Nebulization Once  . mouth rinse  15 mL Mouth Rinse BID  . metoprolol  5 mg Intravenous Q6H  . mirtazapine  15 mg Oral QHS  . piperacillin-tazobactam  3.375 g Intravenous Q8H  . pneumococcal 23 valent vaccine  0.5 mL Intramuscular Tomorrow-1000    . sodium chloride flush  3 mL Intravenous Q12H   Continuous Infusions: . sodium chloride 75 mL/hr at 07/22/16 0900  . albuterol       LOS: 2 days   Archer Vise, Orpah Melter, MD [facility] Pager [phone]  If 7PM-7AM, please contact night-coverage [email or web address] Password [facility] 07/22/2016, 4:13 PM

## 2016-07-23 ENCOUNTER — Inpatient Hospital Stay (HOSPITAL_COMMUNITY): Payer: 59

## 2016-07-23 ENCOUNTER — Ambulatory Visit
Admit: 2016-07-23 | Discharge: 2016-07-23 | Disposition: A | Discharge status: Home / Self Care | Payer: 59 | Attending: Radiation Oncology | Admitting: Radiation Oncology

## 2016-07-23 DIAGNOSIS — C3401 Malignant neoplasm of right main bronchus: Secondary | ICD-10-CM

## 2016-07-23 LAB — CBC
HEMATOCRIT: 37.1 % (ref 36.0–46.0)
HEMOGLOBIN: 11.8 g/dL — AB (ref 12.0–15.0)
MCH: 30 pg (ref 26.0–34.0)
MCHC: 31.8 g/dL (ref 30.0–36.0)
MCV: 94.4 fL (ref 78.0–100.0)
PLATELETS: 345 10*3/uL (ref 150–400)
RBC: 3.93 MIL/uL (ref 3.87–5.11)
RDW: 13.2 % (ref 11.5–15.5)
WBC: 19.1 10*3/uL — ABNORMAL HIGH (ref 4.0–10.5)

## 2016-07-23 LAB — BLOOD GAS, ARTERIAL
ACID-BASE DEFICIT: 3.7 mmol/L — AB (ref 0.0–2.0)
ACID-BASE DEFICIT: 6 mmol/L — AB (ref 0.0–2.0)
Acid-base deficit: 3.3 mmol/L — ABNORMAL HIGH (ref 0.0–2.0)
Acid-base deficit: 4 mmol/L — ABNORMAL HIGH (ref 0.0–2.0)
BICARBONATE: 20.8 mmol/L (ref 20.0–28.0)
Bicarbonate: 23 mmol/L (ref 20.0–28.0)
Bicarbonate: 22.8 mmol/L (ref 20.0–28.0)
Bicarbonate: 20.3 mmol/L (ref 20.0–28.0)
DRAWN BY: 406621
Drawn by: 31101
Drawn by: 313941
Drawn by: 406621
FIO2: 90
FIO2: 90
FIO2: 90
FIO2: 70
LHR: 30 {breaths}/min
LHR: 30 {breaths}/min
LHR: 34 {breaths}/min
MECHVT: 300 mL
O2 SAT: 90.9 %
O2 SAT: 88.5 %
O2 Saturation: 95.9 %
O2 Saturation: 83.1 %
PCO2 ART: 57.5 mmHg — AB (ref 32.0–48.0)
PEEP/CPAP: 5 cmH2O
PEEP: 12 cmH2O
PEEP: 12 cmH2O
PEEP: 12 cmH2O
PO2 ART: 69.7 mmHg — AB (ref 83.0–108.0)
Patient temperature: 99.9
Patient temperature: 99.8
Patient temperature: 99.2
Patient temperature: 98.7
RATE: 34 resp/min
VT: 400 mL
VT: 300 mL
VT: 300 mL
pCO2 arterial: 35.7 mmHg (ref 32.0–48.0)
pCO2 arterial: 63.3 mmHg — ABNORMAL HIGH (ref 32.0–48.0)
pCO2 arterial: 50.5 mmHg — ABNORMAL HIGH (ref 32.0–48.0)
pH, Arterial: 7.387 (ref 7.350–7.450)
pH, Arterial: 7.19 — CL (ref 7.350–7.450)
pH, Arterial: 7.225 — ABNORMAL LOW (ref 7.350–7.450)
pH, Arterial: 7.228 — ABNORMAL LOW (ref 7.350–7.450)
pO2, Arterial: 87.5 mmHg (ref 83.0–108.0)
pO2, Arterial: 83.6 mmHg (ref 83.0–108.0)
pO2, Arterial: 57.5 mmHg — ABNORMAL LOW (ref 83.0–108.0)

## 2016-07-23 LAB — GLUCOSE, CAPILLARY: Glucose-Capillary: 107 mg/dL — ABNORMAL HIGH (ref 65–99)

## 2016-07-23 LAB — BASIC METABOLIC PANEL
ANION GAP: 12 (ref 5–15)
BUN: 16 mg/dL (ref 6–20)
CALCIUM: 8.7 mg/dL — AB (ref 8.9–10.3)
CO2: 20 mmol/L — ABNORMAL LOW (ref 22–32)
Chloride: 112 mmol/L — ABNORMAL HIGH (ref 101–111)
Creatinine, Ser: 0.64 mg/dL (ref 0.44–1.00)
GFR calc non Af Amer: >60 mL/min (ref >60)
Glucose, Bld: 91 mg/dL (ref 65–99)
POTASSIUM: 3.6 mmol/L (ref 3.5–5.1)
Sodium: 144 mmol/L (ref 135–145)

## 2016-07-23 LAB — POCT I-STAT 3, ART BLOOD GAS (G3+)
Acid-base deficit: 8 mmol/L — ABNORMAL HIGH (ref 0.0–2.0)
Bicarbonate: 18.8 mmol/L — ABNORMAL LOW (ref 20.0–28.0)
O2 Saturation: 97 %
PCO2 ART: 45 mmHg (ref 32.0–48.0)
TCO2: 20 mmol/L (ref 0–100)
pH, Arterial: 7.23 — ABNORMAL LOW (ref 7.350–7.450)
pO2, Arterial: 106 mmHg (ref 83.0–108.0)

## 2016-07-23 LAB — MAGNESIUM: MAGNESIUM: 2 mg/dL (ref 1.7–2.4)

## 2016-07-23 LAB — LACTIC ACID, PLASMA: LACTIC ACID, VENOUS: 1.7 mmol/L (ref 0.5–1.9)

## 2016-07-23 LAB — PHOSPHORUS: Phosphorus: 1.9 mg/dL — ABNORMAL LOW (ref 2.5–4.6)

## 2016-07-23 MED ORDER — MIDAZOLAM BOLUS VIA INFUSION
2.0000 mg | INTRAVENOUS | Status: DC | PRN
  Administered 2016-07-23: 2 mg via INTRAVENOUS
  Filled 2016-07-23: qty 2

## 2016-07-23 MED ORDER — ARTIFICIAL TEARS OP OINT
1.0000 "application " | TOPICAL_OINTMENT | Freq: Three times a day (TID) | OPHTHALMIC | Status: DC
  Administered 2016-07-23 – 2016-07-25 (×6): 1 via OPHTHALMIC
  Filled 2016-07-23 (×2): qty 3.5

## 2016-07-23 MED ORDER — POTASSIUM CHLORIDE 20 MEQ/15ML (10%) PO SOLN
20.0000 meq | ORAL | Status: AC
  Administered 2016-07-23 (×2): 20 meq
  Filled 2016-07-23 (×2): qty 15

## 2016-07-23 MED ORDER — CISATRACURIUM BOLUS VIA INFUSION
0.0500 mg/kg | Freq: Once | INTRAVENOUS | Status: DC
  Filled 2016-07-23: qty 3

## 2016-07-23 MED ORDER — FENTANYL BOLUS VIA INFUSION
50.0000 ug | INTRAVENOUS | Status: DC | PRN
  Filled 2016-07-23: qty 50

## 2016-07-23 MED ORDER — PHENYLEPHRINE HCL 10 MG/ML IJ SOLN
INTRAMUSCULAR | Status: AC
  Administered 2016-07-23: 22:00:00 via INTRAVENOUS
  Filled 2016-07-23 (×3): qty 1000

## 2016-07-23 MED ORDER — CISATRACURIUM BESYLATE (PF) 200 MG/20ML IV SOLN
3.0000 ug/kg/min | INTRAVENOUS | Status: DC
  Administered 2016-07-23 – 2016-07-24 (×2): 3 ug/kg/min via INTRAVENOUS
  Administered 2016-07-25: 2.5 ug/kg/min via INTRAVENOUS
  Filled 2016-07-23 (×3): qty 20

## 2016-07-23 MED ORDER — FENTANYL CITRATE (PF) 100 MCG/2ML IJ SOLN: 100.0000 ug | Freq: Once | INTRAMUSCULAR | Status: DC

## 2016-07-23 MED ORDER — SODIUM CHLORIDE 0.9 % IV BOLUS (SEPSIS)
1000.0000 mL | Freq: Once | INTRAVENOUS | Status: AC
  Administered 2016-07-23: 1000 mL via INTRAVENOUS

## 2016-07-23 MED ORDER — FENTANYL 2500MCG IN NS 250ML (10MCG/ML) PREMIX INFUSION
100.0000 ug/h | INTRAVENOUS | Status: DC
  Administered 2016-07-23: 250 ug/h via INTRAVENOUS
  Administered 2016-07-24: 300 ug/h via INTRAVENOUS
  Administered 2016-07-24: 150 ug/h via INTRAVENOUS
  Administered 2016-07-25 (×2): 200 ug/h via INTRAVENOUS
  Filled 2016-07-23 (×5): qty 250

## 2016-07-23 MED ORDER — FENTANYL CITRATE (PF) 100 MCG/2ML IJ SOLN
100.0000 ug | Freq: Once | INTRAMUSCULAR | Status: AC | PRN
  Administered 2016-07-23: 100 ug via INTRAVENOUS

## 2016-07-23 MED ORDER — NOREPINEPHRINE BITARTRATE 1 MG/ML IV SOLN
0.0000 ug/min | INTRAVENOUS | Status: DC
  Administered 2016-07-23: 8 ug/min via INTRAVENOUS
  Administered 2016-07-23 – 2016-07-24 (×2): 10 ug/min via INTRAVENOUS
  Administered 2016-07-24: 8 ug/min via INTRAVENOUS
  Administered 2016-07-25: 10 ug/min via INTRAVENOUS
  Administered 2016-07-25: 8 ug/min via INTRAVENOUS
  Filled 2016-07-23 (×6): qty 4

## 2016-07-23 MED ORDER — MIDAZOLAM HCL 2 MG/2ML IJ SOLN: 2.0000 mg | Freq: Once | INTRAMUSCULAR | Status: DC | PRN

## 2016-07-23 MED ORDER — MIDAZOLAM HCL 5 MG/ML IJ SOLN
2.0000 mg/h | INTRAMUSCULAR | Status: DC
  Administered 2016-07-23 – 2016-07-24 (×2): 6 mg/h via INTRAVENOUS
  Filled 2016-07-23 (×5): qty 10

## 2016-07-23 MED ORDER — MIDAZOLAM HCL 2 MG/2ML IJ SOLN: 2.0000 mg | Freq: Once | INTRAMUSCULAR | Status: DC

## 2016-07-23 MED ORDER — PHENYLEPHRINE HCL 10 MG/ML IJ SOLN
0.0000 ug/min | INTRAMUSCULAR | Status: DC
  Administered 2016-07-23: 200 ug/min via INTRAVENOUS
  Administered 2016-07-24: 175 ug/min via INTRAVENOUS
  Administered 2016-07-24 (×3): 150 ug/min via INTRAVENOUS
  Administered 2016-07-24: 175 ug/min via INTRAVENOUS
  Administered 2016-07-24: 200 ug/min via INTRAVENOUS
  Administered 2016-07-24: 180 ug/min via INTRAVENOUS
  Administered 2016-07-24 (×2): 175 ug/min via INTRAVENOUS
  Administered 2016-07-24 (×2): 150 ug/min via INTRAVENOUS
  Administered 2016-07-24: 175 ug/min via INTRAVENOUS
  Administered 2016-07-24: 150 ug/min via INTRAVENOUS
  Administered 2016-07-24 (×2): 175 ug/min via INTRAVENOUS
  Administered 2016-07-25: 150 ug/min via INTRAVENOUS
  Administered 2016-07-25: 175 ug/min via INTRAVENOUS
  Administered 2016-07-25: 150 ug/min via INTRAVENOUS
  Administered 2016-07-25 (×2): 175 ug/min via INTRAVENOUS
  Administered 2016-07-25: 150 ug/min via INTRAVENOUS
  Administered 2016-07-25: 175 ug/min via INTRAVENOUS
  Administered 2016-07-25: 150 ug/min via INTRAVENOUS
  Administered 2016-07-25 (×4): 175 ug/min via INTRAVENOUS
  Filled 2016-07-23 (×15): qty 1
  Filled 2016-07-23 (×2): qty 4
  Filled 2016-07-23 (×7): qty 1
  Filled 2016-07-23: qty 4
  Filled 2016-07-23 (×2): qty 1
  Filled 2016-07-23: qty 4
  Filled 2016-07-23 (×3): qty 1

## 2016-07-23 MED ORDER — SODIUM PHOSPHATES 45 MMOLE/15ML IV SOLN
20.0000 mmol | Freq: Once | INTRAVENOUS | Status: AC
  Administered 2016-07-23: 20 mmol via INTRAVENOUS
  Filled 2016-07-23: qty 6.67

## 2016-07-23 NOTE — Progress Notes (Signed)
Felicia Long is critically ill, decompensated over the weekend. Now on paralytics, pressors and ARDS protocol. Very poor prognosis.  Met with her husband Nate who is the primary Media planner. He understands how serious this situation is- she is dying. Multi-organ failure. He needs a little bit more time to process what is happening. He is ok not pursuing biopsy given how unstable she is right now but would like autopsy in the event of her death for diagnosis.  I completed FMLA paperwork for her husband.   I was direct in honest with him about the fact that she will most likely not survive this hospitalization.  Time: 50 minutes Greater than 50%  of this time was spent counseling and coordinating care related to the above assessment and plan.   Lane Hacker, DO Palliative Medicine

## 2016-07-23 NOTE — Progress Notes (Signed)
Telephone consent for placement of PICC line obtained via spouse Meda Coffee and verified by second nurse Eulah Pont, RN. Procedure and risk explained and questions answered prior to consent.

## 2016-07-23 NOTE — Progress Notes (Signed)
Spoke with primary RN regarding coming over to insert PICC however she informed me that  "Eddie Dibbles" is going to insert a central line.

## 2016-07-23 NOTE — Progress Notes (Signed)
PULMONARY / CRITICAL CARE MEDICINE   Name: Felicia Long MRN: [id number] DOB: 1966-03-03    ADMISSION DATE:  07/08/2016 CONSULTATION DATE:  07/18/2016   REFERRING MD:  Dr. Roel Cluck  CHIEF COMPLAINT:  Shortness of breath and increased work of breathing  HISTORY OF PRESENT ILLNESS:   Felicia Long is a 51 y/o long time smoker (quit about 2 weeks ago) who was recently evaluated at [facility] for lung nodules found on a CT scan obtained for shortness of breath.  She has undergone a PET CT which shows multiple FDG avid bilateral pulmonary nodules with a large right sided hilar mass.  She was also found to have multiple liver lesions and FDG avid abdominal and thoracic lymph nodes. She came to the [facility] ED with worsening shortness of breath and was found to have O2 saturations in the mid 80s on RA.  A CTA was done to rule out PE which confirmed her multiple pulmonary nodules but did not show PE.  She was wheezing on admission and was given multiple albuterol treatments and 1 hour of continuous albuterol.  Upon admission her O2 saturations improved on 3L [place], however when she arrived on 4E she was noted to have significantly increased WOB, tachypnea and tachycardia.  She was placed on BiPap with improvement in her O2 sats as well as her WOB.  Her ABG on 3L [place] was 7.44/26/71 with a serum CO2 of 20.  Pulmonary was consulted due to her shortness of breath and possible need for a higher level of care.    SUBJECTIVE:  Remains intubated on high PEEP/FiO2. Dyssynchronous with ventilator with high peak/plat pressures.  VITAL SIGNS: BP 92/66   Pulse (!) 150   Temp 99.8 F (37.7 C) (Oral)   Resp (!) 38   Ht '5\' 2"'$  (1.575 m)   Wt 50.9 kg (112 lb 3.4 oz)   LMP 07/06/2016 (Approximate)   SpO2 90%   BMI 20.52 kg/m   HEMODYNAMICS:    VENTILATOR SETTINGS: Vent Mode: PRVC FiO2 (%):  [80 %-100 %] 90 % Set Rate:  [16 bmp-34 bmp] 34 bmp Vt Set:  [300 mL-400 mL] 300 mL PEEP:  [5 cmH20-10 cmH20] 10 cmH20 Plateau Pressure:   [22 cmH20-39 cmH20] 39 cmH20  INTAKE / OUTPUT: I/O last 3 completed shifts: In: 1248.3 [I.V.:968.3; NG/GT:30; IV Piggyback:250] Out: 1960 [TDHRC:1638]  PHYSICAL EXAMINATION: General:  Middle aged female appears to be breathing uncomfortably on ventilator. Neuro:  Sedated HEENT:  [place]/AT, No JVD noted, PERRL Cardiovascular:  RRR, no MRG Lungs:  Coarse bilateral breath sounds Abdomen:  Soft, non-distended, hypoactive Musculoskeletal:  No acute deformity Skin:  Intact, MMM  LABS:  BMET  Recent Labs Lab 07/21/16 1534 07/22/16 0316 07/23/16 0520  NA 139 141 144  K 4.6 4.3 3.6  CL 109 110 112*  CO2 23 23 20*  BUN 5* 7 16  CREATININE 0.46 0.54 0.64  GLUCOSE 104* 114* 91    Electrolytes  Recent Labs Lab 07/21/16 0242 07/21/16 1534 07/22/16 0316 07/23/16 0520  CALCIUM 8.5* 8.3* 8.9 8.7*  MG 1.5* 2.2  --  2.0  PHOS 2.8  --   --  1.9*    CBC  Recent Labs Lab 07/21/16 0242 07/22/16 0316 07/23/16 0520  WBC 18.4* 19.3* 19.1*  HGB 12.4 13.9 11.8*  HCT 38.2 42.0 37.1  PLT 344 355 345    Coag's  Recent Labs Lab 07/22/2016 1543 07/21/16 0242  APTT  --  31  INR 1.04 1.14  Sepsis Markers  Recent Labs Lab 07/19/2016 2305  07/22/16 1002 07/22/16 1254 07/23/16 0521  LATICACIDVEN 3.0*  < > 1.4 1.5 1.7  PROCALCITON 0.19  --   --   --   --   < > = values in this interval not displayed.  ABG  Recent Labs Lab 07/22/16 1844 07/22/16 2224 07/23/16 0252  PHART 7.134* 7.375 7.387  PCO2ART 76.1* 34.2 35.7  PO2ART 86.0 74.0* 87.5    Liver Enzymes  Recent Labs Lab 07/06/2016 1354 07/21/16 0242  AST 27 28  ALT 19 17  ALKPHOS 73 62  BILITOT 0.5 0.7  ALBUMIN 3.4* 2.7*    Cardiac Enzymes  Recent Labs Lab 07/19/2016 1543  TROPONINI <0.03    Glucose  Recent Labs Lab 07/22/16 1719  GLUCAP 97    Imaging Dg Chest Port 1 View  Result Date: 07/23/2016 CLINICAL DATA:  51 year old female respiratory failure. Subsequent encounter. EXAM: PORTABLE  CHEST 1 VIEW COMPARISON:  07/22/2016 chest x-ray.  07/26/2016 chest CT. FINDINGS: Right hilar mass.  Multiple pulmonary lung nodules. Diffuse increased interstitial markings may represent spread of tumor or pulmonary edema. Difficult to exclude superimposed infectious infiltrate. Overall findings similar to recent exam. Endotracheal tube tip 2.5 cm above the carina. Nasogastric tube courses below the diaphragm. Tip is not included on the present exam. Heart slightly enlarged. IMPRESSION: Right hilar mass, multiple pulmonary nodules and increased interstitial markings similar to prior exam. Electronically Signed   By: Genia Del M.D.   On: 07/23/2016 07:00   Portable Chest Xray  Result Date: 07/22/2016 CLINICAL DATA:  RIGHT pulmonary mass. Multiple pulmonary nodules. Respiratory failure. EXAM: PORTABLE CHEST 1 VIEW COMPARISON:  CT 07/11/2016 FINDINGS: Endotracheal tube 2.4 cm from carina.  NG tube in stomach. RIGHT suprahilar mass again noted. Innumerable pulmonary nodules again noted. IMPRESSION: Endotracheal tube in good position. RIGHT hilar mass and innumerable pulmonary nodules. Electronically Signed   By: Suzy Bouchard M.D.   On: 07/22/2016 18:08   Dg Abd Portable 1v  Result Date: 07/22/2016 CLINICAL DATA:  51 year old female with respiratory failure status post intubation and enteric tube placement. EXAM: PORTABLE ABDOMEN - 1 VIEW COMPARISON:  None. FINDINGS: Partially visualized enteric tube within the stomach. The side port of the tube is in gastric body and the tip in the distal stomach. No bowel dilatation or evidence of obstruction. No free air or radiopaque calculi noted on this provided image. The osseous structures and soft tissues appear unremarkable. IMPRESSION: Enteric tube with tip in the distal stomach. Electronically Signed   By: Anner Crete M.D.   On: 07/22/2016 18:11     STUDIES:  CTA - 07/27/2016 > Right hilar mass with innumerable pulmonary nodules, extensive adenopathy.  Liver masses. Almost certainly mets.  CULTURES: Blood - 08/04/2016 >  ANTIBIOTICS: Vanc 07/31/2016 > Zosyn 07/10/2016 >  SIGNIFICANT EVENTS: BiPAP started 07/16/2016  3/18 to ICU for intubation> ARDS protocol  LINES/TUBES: ETT 3/19 >  DISCUSSION: 51 y/o woman with likely widely metastatic lung cancer presented with worsening shortness of breath.  Improved with BiPap however low threshold for transfer to ICU if worsens.  ASSESSMENT / PLAN:  PULMONARY A: ARDS/hypoxemic respiratory failure secondary to wide metasteses to lungs.  Metastatic Lung Cancer -not biopsy proven.   Tobacco abuse disorder  P:   Full vent support Escalate to ARDS protocol with 6cc/kg and NMB  Serial ABG, will need art line Follow CXR Cont on duoneb q 4h  CARDIOVASCULAR A:  Tachycardia, appears sinus  P:  Telemetry EKG now Will need CVL placed PRN metoprolol  RENAL A:   Hyponatremia (corrected) Lactic acidosis (improved)  P:   Follow BMP  INFECTIOUS A:   Possible PNA, doubt  P:   Continue vanc/zosyn  Follow cultures No role PCT in likely cancer. Trend fever curve and WBC  HEME/ONC A: Widely metastatic cancer, not biopsy proven.  Anemia  P: Would not tolerate bronchoscopic biopsy at this point in my opinion If she can regain stability, IR biopsy of liver probably best/safest bet for tissue. IR following Oncology/Rad onc following Imagine prognosis is poor.  NEUROLOGIC A:   Acute metabolic encephalopathy At risk for brain mets P:   Sedation and NMB per ARDS protocol Fentanyl/Versed BIS, TO4 monitoring Will need brain MRI to r/o brain mets Sedation protocol   FAMILY  - Updates: Husband/daughter updated 3/19  Georgann Housekeeper, AGACNP-BC [facility] Pager [phone] or [phone]  07/23/2016 12:13 PM  Attending Note:  I have examined patient, reviewed labs, studies and notes. I have discussed the case with Felicia Long, and I agree with the data and  plans as amended above. Unfortunate 51 yo woman w new dx B nodular disease, findings most consistent with metastatic disease. She has had rapidly progressive respiratory failure, now ARDS. She is on MV, high PEEP and FiO2 needs. On my eval she remains unstable from resp and hemodynamic standpoints. Lungs are coarse. She is deeply sedated. We have hoped to be able to pursue tissue dx, most likely via hepatic biopsy, but she is clearly unstable. I suspect that she has a RUL endobronchial lesion but bronchoscopy would be very high risk. Form a global perspective I fear that the rapid progression of her resp failure will make any potential cancer therapies irrelevant. Specifically I doubt that chemotherapy would enhance her chances to get off mechanical ventilation. We will initiate paralytics today, hopefully be able to make progress towards extubation. If unable to make such progress quickly then I believe we will need to consider withdrawal of care. Greatly appreciate Dr Delanna Ahmadi input and assistance.  Independent critical care time is 35 minutes.   Baltazar Apo, MD, PhD 07/23/2016, 4:02 PM [facility] [phone] or if no answer [phone]

## 2016-07-23 NOTE — Progress Notes (Signed)
Pharmacy Antibiotic Note  Felicia Long is a 51 y.o. female admitted on 07/07/2016 with sepsis.  Pharmacy has been consulted for Zosyn dosing. Recent diagnosis of metastatic lung CA (not biopsy diagnosed). WBC mildly elevated with minimal trend down. Renal function is good and stable.   Plan: Zosyn 3.375G IV q8h to be infused over 4 hours Trend WBC, temp, renal function  Follow-up length of therapy.   Temp (24hrs), Avg:99.1 F (37.3 C), Min:97.5 F (36.4 C), Max:99.9 F (37.7 C)   Recent Labs Lab 08/04/2016 1354  07/09/2016 2305 07/21/16 0242 07/21/16 1534 07/22/16 0316 07/22/16 1002 07/22/16 1254 07/23/16 0520 07/23/16 0521  WBC 13.6*  --   --  18.4*  --  19.3*  --   --  19.1*  --   CREATININE 0.50  --   --  0.73 0.46 0.54  --   --  0.64  --   LATICACIDVEN  --   < > 3.0* 6.2*  --   --  1.4 1.5  --  1.7  < > = values in this interval not displayed.  Estimated Creatinine Clearance: 68 mL/min (by C-G formula based on SCr of 0.64 mg/dL).    No Known Allergies   Antimicrobials this admission:  3/16 vancomycin >> 3/17 3/16 zosyn >>   Dose adjustments this admission:  N/A  Microbiology results:  3/16 BCx: sent 3/16 MRSA PCR: negative  Brain Hilts 07/23/2016 3:01 PM

## 2016-07-23 NOTE — Progress Notes (Signed)
Patient ID: Felicia Long, female   DOB: [birth year], 51 y.o.   MRN: [id number]   Liver biopsy and Port  A Cath placement request received in IR  Imaging reviewed with Dr Kathlene Cote  Until pt is more stable Must HOLD on any IR procedure per Dr Kathlene Cote  Please re order procedure when MD feels pt is more appropiate

## 2016-07-23 NOTE — Progress Notes (Signed)
[facility] ADULT ICU REPLACEMENT PROTOCOL FOR AM LAB REPLACEMENT ONLY  The patient does apply for the [facility] Adult ICU Electrolyte Replacment Protocol based on the criteria listed below:   1. Is GFR >/= 40 ml/min? Yes.    Patient's GFR today is >60 2. Is urine output >/= 0.5 ml/kg/hr for the last 6 hours? Yes.   Patient's UOP is 0.9 ml/kg/hr 3. Is BUN < 60 mg/dL? Yes.    Patient's BUN today is 16 4. Abnormal electrolyte(s):K3.6,Phos1.9 5. Ordered repletion with: per protocol 6. If a panic level lab has been reported, has the CCM MD in charge been notified? Yes.  .   Physician:  Levada Schilling, MD  V[facility] Clock 07/23/2016 6:30 AM

## 2016-07-23 NOTE — Care Management Note (Signed)
Case Management Note  Patient Details  Name: Felicia Long MRN: [id number] Date of Birth: [birth year]  Subjective/Objective:              Pt admitted with SOB and Increased WOB  - recent dx of lung CA   Action/Plan:   Pt is intubated and with very poor prognosis.  Palliative Care following - will likely be terminal wean   Expected Discharge Date:                  Expected Discharge Plan:     In-House Referral:     Discharge planning Services  CM Consult  Post Acute Care Choice:    Choice offered to:     DME Arranged:    DME Agency:     HH Arranged:    [place] Agency:     Status of Service:  In process, will continue to follow  If discussed at Long Length of Stay Meetings, dates discussed:    Additional Comments:  Maryclare Labrador, RN 07/23/2016, 2:52 PM

## 2016-07-24 LAB — BASIC METABOLIC PANEL
ANION GAP: 13 (ref 5–15)
ANION GAP: 11 (ref 5–15)
BUN: 27 mg/dL — ABNORMAL HIGH (ref 6–20)
BUN: 32 mg/dL — ABNORMAL HIGH (ref 6–20)
CALCIUM: 7.9 mg/dL — AB (ref 8.9–10.3)
CHLORIDE: 122 mmol/L — AB (ref 101–111)
CO2: 16 mmol/L — AB (ref 22–32)
CO2: 15 mmol/L — AB (ref 22–32)
Calcium: 7.8 mg/dL — ABNORMAL LOW (ref 8.9–10.3)
Chloride: 116 mmol/L — ABNORMAL HIGH (ref 101–111)
Creatinine, Ser: 0.92 mg/dL (ref 0.44–1.00)
Creatinine, Ser: 1.31 mg/dL — ABNORMAL HIGH (ref 0.44–1.00)
GFR calc non Af Amer: 47 mL/min — ABNORMAL LOW (ref >60)
GFR, EST AFRICAN AMERICAN: 55 mL/min — AB (ref >60)
GLUCOSE: 107 mg/dL — AB (ref 65–99)
Glucose, Bld: 111 mg/dL — ABNORMAL HIGH (ref 65–99)
Potassium: 5.7 mmol/L — ABNORMAL HIGH (ref 3.5–5.1)
Potassium: 5.2 mmol/L — ABNORMAL HIGH (ref 3.5–5.1)
Sodium: 145 mmol/L (ref 135–145)
Sodium: 148 mmol/L — ABNORMAL HIGH (ref 135–145)

## 2016-07-24 LAB — BLOOD GAS, ARTERIAL
ACID-BASE DEFICIT: 11.7 mmol/L — AB (ref 0.0–2.0)
ACID-BASE DEFICIT: 10.8 mmol/L — AB (ref 0.0–2.0)
Acid-base deficit: 11.7 mmol/L — ABNORMAL HIGH (ref 0.0–2.0)
Acid-base deficit: 14 mmol/L — ABNORMAL HIGH (ref 0.0–2.0)
BICARBONATE: 15.3 mmol/L — AB (ref 20.0–28.0)
BICARBONATE: 15 mmol/L — AB (ref 20.0–28.0)
BICARBONATE: 12.1 mmol/L — AB (ref 20.0–28.0)
Bicarbonate: 14.6 mmol/L — ABNORMAL LOW (ref 20.0–28.0)
DRAWN BY: 406621
DRAWN BY: 406621
DRAWN BY: 406621
Drawn by: 406621
FIO2: 70
FIO2: 70
FIO2: 70
FIO2: 0.7
LHR: 34 {breaths}/min
LHR: 34 {breaths}/min
LHR: 35 {breaths}/min
MECHVT: 300 mL
MECHVT: 350 mL
O2 SAT: 90.5 %
O2 Saturation: 89.9 %
O2 Saturation: 93 %
O2 Saturation: 91.3 %
PCO2 ART: 44.4 mmHg (ref 32.0–48.0)
PCO2 ART: 37 mmHg (ref 32.0–48.0)
PCO2 ART: 34.7 mmHg (ref 32.0–48.0)
PCO2 ART: 30 mmHg — AB (ref 32.0–48.0)
PEEP/CPAP: 12 cmH2O
PEEP: 12 cmH2O
PEEP: 12 cmH2O
PEEP: 12 cmH2O
PH ART: 7.219 — AB (ref 7.350–7.450)
PH ART: 7.255 — AB (ref 7.350–7.450)
PO2 ART: 76 mmHg — AB (ref 83.0–108.0)
Patient temperature: 98.5
Patient temperature: 98.5
Patient temperature: 98
Patient temperature: 98.5
RATE: 35 resp/min
VT: 350 mL
VT: 350 mL
pH, Arterial: 7.164 — CL (ref 7.350–7.450)
pH, Arterial: 7.229 — ABNORMAL LOW (ref 7.350–7.450)
pO2, Arterial: 74.4 mmHg — ABNORMAL LOW (ref 83.0–108.0)
pO2, Arterial: 79 mmHg — ABNORMAL LOW (ref 83.0–108.0)
pO2, Arterial: 77.8 mmHg — ABNORMAL LOW (ref 83.0–108.0)

## 2016-07-24 LAB — POCT I-STAT 3, ART BLOOD GAS (G3+)
Acid-base deficit: 10 mmol/L — ABNORMAL HIGH (ref 0.0–2.0)
Acid-base deficit: 10 mmol/L — ABNORMAL HIGH (ref 0.0–2.0)
Bicarbonate: 15.7 mmol/L — ABNORMAL LOW (ref 20.0–28.0)
Bicarbonate: 15.8 mmol/L — ABNORMAL LOW (ref 20.0–28.0)
O2 SAT: 93 %
O2 Saturation: 93 %
PCO2 ART: 35.5 mmHg (ref 32.0–48.0)
PCO2 ART: 36.3 mmHg (ref 32.0–48.0)
PH ART: 7.252 — AB (ref 7.350–7.450)
PH ART: 7.259 — AB (ref 7.350–7.450)
TCO2: 17 mmol/L (ref 0–100)
TCO2: 17 mmol/L (ref 0–100)
pO2, Arterial: 79 mmHg — ABNORMAL LOW (ref 83.0–108.0)
pO2, Arterial: 82 mmHg — ABNORMAL LOW (ref 83.0–108.0)

## 2016-07-24 LAB — PHOSPHORUS: PHOSPHORUS: 4.7 mg/dL — AB (ref 2.5–4.6)

## 2016-07-24 MED ORDER — STERILE WATER FOR INJECTION IV SOLN
INTRAVENOUS | Status: DC
  Administered 2016-07-24: 18:00:00 via INTRAVENOUS
  Filled 2016-07-24 (×4): qty 850

## 2016-07-24 MED ORDER — SODIUM BICARBONATE 8.4 % IV SOLN
50.0000 meq | Freq: Once | INTRAVENOUS | Status: AC
Start: 2016-07-24 — End: 2016-07-24
  Administered 2016-07-24: 50 meq via INTRAVENOUS
  Filled 2016-07-24: qty 50

## 2016-07-24 MED ORDER — SODIUM BICARBONATE 8.4 % IV SOLN
INTRAVENOUS | Status: AC
  Administered 2016-07-24: 50 meq
  Filled 2016-07-24: qty 50

## 2016-07-24 MED ORDER — SODIUM BICARBONATE 8.4 % IV SOLN
INTRAVENOUS | Status: AC
  Filled 2016-07-24: qty 50

## 2016-07-24 NOTE — Progress Notes (Signed)
Hematology oncology I met with the patient's family and read the progress notes. The decision has been made to not pursue any biopsies or interventions. I offered the family support and condolences.  Oncology will sign off. 

## 2016-07-24 NOTE — Progress Notes (Signed)
PULMONARY / CRITICAL CARE MEDICINE   Name: Felicia Long MRN: [id number] DOB: May 03, 1966    ADMISSION DATE:  07/24/2016 CONSULTATION DATE:  07/26/2016   REFERRING MD:  Dr. Roel Cluck  CHIEF COMPLAINT:  Shortness of breath and increased work of breathing  HISTORY OF PRESENT ILLNESS:   Felicia Long is a 51 y/o long time smoker (quit about 2 weeks ago) who was recently evaluated at [facility] for lung nodules found on a CT scan obtained for shortness of breath.  She has undergone a PET CT which shows multiple FDG avid bilateral pulmonary nodules with a large right sided hilar mass.  She was also found to have multiple liver lesions and FDG avid abdominal and thoracic lymph nodes. She came to the [facility] ED with worsening shortness of breath and was found to have O2 saturations in the mid 80s on RA.  A CTA was done to rule out PE which confirmed her multiple pulmonary nodules but did not show PE.  She was wheezing on admission and was given multiple albuterol treatments and 1 hour of continuous albuterol.  Upon admission her O2 saturations improved on 3L [place], however when she arrived on 4E she was noted to have significantly increased WOB, tachypnea and tachycardia.  She was placed on BiPap with improvement in her O2 sats as well as her WOB.  Her ABG on 3L [place] was 7.44/26/71 with a serum CO2 of 20.  Pulmonary was consulted due to her shortness of breath and possible need for a higher level of care.    SUBJECTIVE:  Remains critically ill on vent with neuromuscular blockade and on pressors.  VITAL SIGNS: BP 91/72 (BP Location: Right Arm)   Pulse (!) 112   Temp 98.3 F (36.8 C) (Oral)   Resp (!) 34   Ht '5\' 2"'$  (1.575 m)   Wt 56.9 kg (125 lb 7.1 oz)   LMP 07/06/2016 (Approximate)   SpO2 100%   BMI 22.94 kg/m   HEMODYNAMICS:    VENTILATOR SETTINGS: Vent Mode: PRVC FiO2 (%):  [70 %-100 %] 70 % Set Rate:  [30 bmp-34 bmp] 34 bmp Vt Set:  [300 mL-350 mL] 350 mL PEEP:  [10 cmH20-14 cmH20] 12 cmH20 Plateau Pressure:   [36 cmH20-39 cmH20] 36 cmH20  INTAKE / OUTPUT: I/O last 3 completed shifts: In: 9343.5 [I.V.:7855.5; NG/GT:30; IV Piggyback:1458] Out: 1885 [Urine:1035; Emesis/NG output:850]  PHYSICAL EXAMINATION:  General:  Female of normal body habitus on vent Neuro:  Sedated/on NMB HEENT:  [place]/AT, PERRL, no JVD Cardiovascular:  RRR, no MRG Lungs:Coarse Abdomen:  Soft, non-distended, hypoactive Musculoskeletal:  No acute deformity Skin:  Intact, MMM  LABS:  BMET  Recent Labs Lab 07/22/16 0316 07/23/16 0520 07/24/16 0558  NA 141 144 145  K 4.3 3.6 5.7*  CL 110 112* 116*  CO2 23 20* 16*  BUN 7 16 27*  CREATININE 0.54 0.64 0.92  GLUCOSE 114* 91 111*    Electrolytes  Recent Labs Lab 07/21/16 0242 07/21/16 1534 07/22/16 0316 07/23/16 0520 07/24/16 0558  CALCIUM 8.5* 8.3* 8.9 8.7* 7.9*  MG 1.5* 2.2  --  2.0  --   PHOS 2.8  --   --  1.9* 4.7*    CBC  Recent Labs Lab 07/21/16 0242 07/22/16 0316 07/23/16 0520  WBC 18.4* 19.3* 19.1*  HGB 12.4 13.9 11.8*  HCT 38.2 42.0 37.1  PLT 344 355 345    Coag's  Recent Labs Lab 07/19/2016 1543 07/21/16 0242  APTT  --  31  INR  1.04 1.14    Sepsis Markers  Recent Labs Lab 07/19/2016 2305  07/22/16 1002 07/22/16 1254 07/23/16 0521  LATICACIDVEN 3.0*  < > 1.4 1.5 1.7  PROCALCITON 0.19  --   --   --   --   < > = values in this interval not displayed.  ABG  Recent Labs Lab 07/23/16 1728 07/23/16 2333 07/24/16 0807  PHART 7.228* 7.230* 7.164*  PCO2ART 50.5* 45.0 44.4  PO2ART 57.5* 106.0 76.0*    Liver Enzymes  Recent Labs Lab 07/29/2016 1354 07/21/16 0242  AST 27 28  ALT 19 17  ALKPHOS 73 62  BILITOT 0.5 0.7  ALBUMIN 3.4* 2.7*    Cardiac Enzymes  Recent Labs Lab 07/23/2016 1543  TROPONINI <0.03    Glucose  Recent Labs Lab 07/22/16 1719 07/23/16 2003  GLUCAP 97 107*    Imaging No results found.   STUDIES:  CTA - 07/19/2016 > Right hilar mass with innumerable pulmonary nodules, extensive  adenopathy. Liver masses. Almost certainly mets.  CULTURES: Blood - 07/15/2016 >  ANTIBIOTICS: Vanc 07/12/2016 > Zosyn 07/24/2016 >  SIGNIFICANT EVENTS: BiPAP started 07/23/2016  3/18 to ICU for intubation> ARDS protocol  LINES/TUBES: ETT 3/19 >  DISCUSSION: 51 y/o woman with likely widely metastatic lung cancer presented with worsening shortness of breath.  Improved with BiPap initially but then required transfer to ICU and was intubated. Progressed to ARDS needs with increased PEEP and neuromuscular blockade.  ASSESSMENT / PLAN:  PULMONARY A: ARDS/hypoxemic respiratory failure secondary to wide metasteses to lungs.  Metastatic lung cancer - not biopsy proven.   Tobacco abuse disorder  P:   Full vent support Escalate to ARDS protocol with 6cc/kg and NMB > due to acidosis will try 7cc/kg Vt as plats are improved Serial ABG Follow CXR DC duoneb due to tachycardia and likely little benefit  CARDIOVASCULAR A:  Shock, likely due to sedation needs Tachycardia, appears sinus  P:  Telemetry Continue low dose levo/neo Will defer CVL based on palliative discussion yesterday  RENAL A:   Hyponatremia (corrected) Lactic acidosis (improved) Hyperkalemia > likely in setting of acidosis and Creatinine is wnl and no supplementation has been given.   P:   Vent settings adjusted to address acidosis Give amp bicarb Consider Kayexalate if not improved Follow BMP  INFECTIOUS A:   Possible PNA, doubt  P:   Continue vanc/zosyn  Would DC vanc 3/21 if no culture growth Follow cultures No role PCT in cancer Follow WBC and fever curve  HEME/ONC A: Widely metastatic cancer, not biopsy proven.  Anemia  P: Would not currently tolerate biopsy Family ok to forgo this and obtain in autopsy if required Oncology/Rad onc following Prognosis very poor  NEUROLOGIC A:   Acute metabolic encephalopathy At risk for brain mets P:   Fentanyl/Versed for sedation Nimbex for neuromuscular  blockade BIS, TO4 monitoring Will defer brain imaging  FAMILY  - Updates: No family bedside  Georgann Housekeeper, AGACNP-BC [place] [facility] Pager [phone] or [phone]  07/24/2016 9:30 AM  Attending Note:  I have examined patient, reviewed labs, studies and notes. I have discussed the case with Jaclynn Guarneri, and I agree with the data and plans as amended above. 51 year old woman with rapidly progressive respiratory failure in the setting of bilateral nodular disease that has been felt to be most can consistent with malignancy. She remains in the ICU on mechanical ventilation with ARDS, high PEEP and FiO2 needs. Her respiration status has been more labile and paralytics  were started yesterday. Unfortunately the rapid progression of her disease likely precludes any biopsy or targeted therapy for her cancer. On my evaluation her exam is unchanged.  She has been seen by palliative care and I greatly appreciate Dr. Delanna Ahmadi assistance. Based on her rapid decline and the low likelihood of any improvement decision has been made to withdraw care this week. We will continue her current level of care, try to maintain stability until her family is able to gather.  Independent critical care time is 32 minutes.   Baltazar Apo, MD, PhD 07/24/2016, 2:51 PM [place] [facility] [phone] or if no answer [phone]

## 2016-07-24 NOTE — Progress Notes (Signed)
ABG 7.22 /30/77 bicarb 12 resulted to [facility] and also updated decreased Blood pressure despite increasing levophed infusion. Orders received for Bicarb infusion. I updated patient's husband at bedside on the above changes

## 2016-07-25 ENCOUNTER — Other Ambulatory Visit: Payer: Self-pay | Admitting: Emergency Medicine

## 2016-07-25 LAB — CBC
HCT: 43.1 % (ref 36.0–46.0)
Hemoglobin: 13 g/dL (ref 12.0–15.0)
MCH: 29.5 pg (ref 26.0–34.0)
MCHC: 30.2 g/dL (ref 30.0–36.0)
MCV: 98 fL (ref 78.0–100.0)
PLATELETS: 215 10*3/uL (ref 150–400)
RBC: 4.4 MIL/uL (ref 3.87–5.11)
RDW: 13.6 % (ref 11.5–15.5)
WBC: 13.6 10*3/uL — AB (ref 4.0–10.5)

## 2016-07-25 LAB — BLOOD GAS, ARTERIAL
ACID-BASE DEFICIT: 11.1 mmol/L — AB (ref 0.0–2.0)
Bicarbonate: 14.6 mmol/L — ABNORMAL LOW (ref 20.0–28.0)
DRAWN BY: 31101
FIO2: 70
MECHVT: 350 mL
O2 Saturation: 91.4 %
PEEP/CPAP: 12 cmH2O
PH ART: 7.231 — AB (ref 7.350–7.450)
Patient temperature: 102.2
RATE: 35 resp/min
pCO2 arterial: 37.4 mmHg (ref 32.0–48.0)
pO2, Arterial: 87.5 mmHg (ref 83.0–108.0)

## 2016-07-25 LAB — BASIC METABOLIC PANEL
ANION GAP: 15 (ref 5–15)
BUN: 43 mg/dL — ABNORMAL HIGH (ref 6–20)
CALCIUM: 7.2 mg/dL — AB (ref 8.9–10.3)
CO2: 15 mmol/L — ABNORMAL LOW (ref 22–32)
Chloride: 116 mmol/L — ABNORMAL HIGH (ref 101–111)
Creatinine, Ser: 2.06 mg/dL — ABNORMAL HIGH (ref 0.44–1.00)
GFR, EST AFRICAN AMERICAN: 32 mL/min — AB (ref >60)
GFR, EST NON AFRICAN AMERICAN: 27 mL/min — AB (ref >60)
Glucose, Bld: 115 mg/dL — ABNORMAL HIGH (ref 65–99)
Potassium: 5.2 mmol/L — ABNORMAL HIGH (ref 3.5–5.1)
SODIUM: 146 mmol/L — AB (ref 135–145)

## 2016-07-25 MED ORDER — POLYVINYL ALCOHOL 1.4 % OP SOLN
1.0000 [drp] | Freq: Four times a day (QID) | OPHTHALMIC | Status: DC | PRN
  Filled 2016-07-25: qty 15

## 2016-07-25 MED ORDER — MIDAZOLAM BOLUS VIA INFUSION
2.0000 mg | INTRAVENOUS | Status: DC | PRN
  Filled 2016-07-25: qty 2

## 2016-07-25 MED ORDER — GLYCOPYRROLATE 1 MG PO TABS: 1.0000 mg | ORAL_TABLET | ORAL | Status: DC | PRN

## 2016-07-25 MED ORDER — FENTANYL BOLUS VIA INFUSION
50.0000 ug | INTRAVENOUS | Status: DC | PRN
  Filled 2016-07-25: qty 100

## 2016-07-25 MED ORDER — PROPOFOL 10 MG/ML IV BOLUS: 5.0000 mg | INTRAVENOUS | Status: DC | PRN

## 2016-07-25 MED ORDER — GLYCOPYRROLATE 0.2 MG/ML IJ SOLN: 0.2000 mg | INTRAMUSCULAR | Status: DC | PRN

## 2016-07-25 MED ORDER — ENOXAPARIN SODIUM 30 MG/0.3ML ~~LOC~~ SOLN: 30.0000 mg | SUBCUTANEOUS | Status: DC

## 2016-07-25 MED ORDER — SODIUM CHLORIDE 0.9 % IV SOLN: INTRAVENOUS | Status: DC

## 2016-07-26 LAB — CULTURE, BLOOD (ROUTINE X 2)
CULTURE: NO GROWTH
CULTURE: NO GROWTH

## 2016-07-30 ENCOUNTER — Telehealth: Payer: Self-pay

## 2016-07-30 NOTE — Telephone Encounter (Signed)
On 08-06-16 I received a death certificate from Richrd Humbles (original). The death certificate is for cremation. The patient is a patient of Doctor Byrum. The death certificate will be taken to [facility] ([place]) this am for signature.  On 08-06-2016 I received the death certificate back from Doctor Byrum. I got the death certificate ready and called the funeral home to let them know the death certificate is ready for pickup. I also faxed a copy to the funeral home per the funeral home request.

## 2016-07-31 LAB — AEROBIC/ANAEROBIC CULTURE (SURGICAL/DEEP WOUND): CULTURE: NO GROWTH

## 2016-07-31 LAB — AEROBIC/ANAEROBIC CULTURE W GRAM STAIN (SURGICAL/DEEP WOUND): Culture: NO GROWTH

## 2016-08-05 NOTE — Progress Notes (Addendum)
Patient death pronounced at 1451 no heart sounds or breaths auscultated and confirmed by Allegra Grana RN with Family at bedside.ELink Dr Alva Garnet notified of death

## 2016-08-05 NOTE — Progress Notes (Signed)
PULMONARY / CRITICAL CARE MEDICINE   Name: Ailanie Ruttan MRN: [id number] DOB: 1965/06/04    ADMISSION DATE:  07/12/2016 CONSULTATION DATE:  07/14/2016   REFERRING MD:  Dr. Roel Cluck  CHIEF COMPLAINT:  Shortness of breath and increased work of breathing  HISTORY OF PRESENT ILLNESS:   Ms. Fencl is a 51 y/o long time smoker (quit about 2 weeks ago) who was recently evaluated at [facility] for lung nodules found on a CT scan obtained for shortness of breath.  She has undergone a PET CT which shows multiple FDG avid bilateral pulmonary nodules with a large right sided hilar mass.  She was also found to have multiple liver lesions and FDG avid abdominal and thoracic lymph nodes. She came to the [facility] ED with worsening shortness of breath and was found to have O2 saturations in the mid 80s on RA.  A CTA was done to rule out PE which confirmed her multiple pulmonary nodules but did not show PE.  She was wheezing on admission and was given multiple albuterol treatments and 1 hour of continuous albuterol.  Upon admission her O2 saturations improved on 3L [place], however when she arrived on 4E she was noted to have significantly increased WOB, tachypnea and tachycardia.  She was placed on BiPap with improvement in her O2 sats as well as her WOB.  Her ABG on 3L [place] was 7.44/26/71 with a serum CO2 of 20.  Pulmonary was consulted due to her shortness of breath and possible need for a higher level of care.    SUBJECTIVE:  Remains critically ill on vent with neuromuscular blockade and on pressors.  VITAL SIGNS: BP 93/69   Pulse (!) 125   Temp (!) 100.8 F (38.2 C) (Oral) Comment: RN Phyllis aware   Resp (!) 35   Ht '5\' 2"'$  (1.575 m)   Wt 56.9 kg (125 lb 7.1 oz)   LMP 07/06/2016 (Approximate)   SpO2 92%   BMI 22.94 kg/m   HEMODYNAMICS:    VENTILATOR SETTINGS: Vent Mode: PRVC FiO2 (%):  [70 %] 70 % Set Rate:  [35 bmp] 35 bmp Vt Set:  [350 mL] 350 mL PEEP:  [12 cmH20] 12 cmH20 Plateau Pressure:  [36 cmH20-40 cmH20] 37  cmH20  INTAKE / OUTPUT: I/O last 3 completed shifts: In: 14313.6 [I.V.:14026.1; NG/GT:100; IV Piggyback:187.5] Out: 1265 [Urine:540; Emesis/NG output:725]  PHYSICAL EXAMINATION:  General:  Female of normal body habitus in NAD. Neuro:  Sedated/on NMB. HEENT:  [place]/AT, PERRL, no JVD. Cardiovascular:  RRR, no MRG. Lungs: Coarse. Abdomen:  Soft, non-distended, hypoactive. Musculoskeletal:  No acute deformity. Skin:  Intact, MMM.  LABS:  BMET  Recent Labs Lab 07/24/16 0558 07/24/16 1500 Aug 20, 2016 0430  NA 145 148* 146*  K 5.7* 5.2* 5.2*  CL 116* 122* 116*  CO2 16* 15* 15*  BUN 27* 32* 43*  CREATININE 0.92 1.31* 2.06*  GLUCOSE 111* 107* 115*    Electrolytes  Recent Labs Lab 07/21/16 0242 07/21/16 1534  07/23/16 0520 07/24/16 0558 07/24/16 1500 August 20, 2016 0430  CALCIUM 8.5* 8.3*  < > 8.7* 7.9* 7.8* 7.2*  MG 1.5* 2.2  --  2.0  --   --   --   PHOS 2.8  --   --  1.9* 4.7*  --   --   < > = values in this interval not displayed.  CBC  Recent Labs Lab 07/22/16 0316 07/23/16 0520 20-Aug-2016 0430  WBC 19.3* 19.1* 13.6*  HGB 13.9 11.8* 13.0  HCT 42.0 37.1 43.1  PLT 355 345 215    Coag's  Recent Labs Lab 07/12/2016 1543 07/21/16 0242  APTT  --  31  INR 1.04 1.14    Sepsis Markers  Recent Labs Lab 07/14/2016 2305  07/22/16 1002 07/22/16 1254 07/23/16 0521  LATICACIDVEN 3.0*  < > 1.4 1.5 1.7  PROCALCITON 0.19  --   --   --   --   < > = values in this interval not displayed.  ABG  Recent Labs Lab 07/24/16 1958 07/24/16 2341 08/24/16 0235  PHART 7.252* 7.259* 7.231*  PCO2ART 36.3 35.5 37.4  PO2ART 82.0* 79.0* 87.5    Liver Enzymes  Recent Labs Lab 07/16/2016 1354 07/21/16 0242  AST 27 28  ALT 19 17  ALKPHOS 73 62  BILITOT 0.5 0.7  ALBUMIN 3.4* 2.7*    Cardiac Enzymes  Recent Labs Lab 07/19/2016 1543  TROPONINI <0.03    Glucose  Recent Labs Lab 07/22/16 1719 07/23/16 2003  GLUCAP 97 107*    Imaging No results  found.   STUDIES:  CTA - 07/27/2016 > Right hilar mass with innumerable pulmonary nodules, extensive adenopathy. Liver masses. Almost certainly mets.  CULTURES: Blood - 07/21/2016 >  ANTIBIOTICS: Vanc 08/04/2016 > Zosyn 07/23/2016 >  SIGNIFICANT EVENTS: BiPAP started 07/23/2016  3/18 to ICU for intubation> ARDS protocol  LINES/TUBES: ETT 3/19 >  DISCUSSION: 51 y/o woman with likely widely metastatic lung cancer presented with worsening shortness of breath.  Improved with BiPap initially but then required transfer to ICU and was intubated. Progressed to ARDS needs with increased PEEP and neuromuscular blockade.  ASSESSMENT / PLAN:   ARDS/hypoxemic respiratory failure secondary to wide metasteses to lungs.  Metastatic lung cancer - not biopsy proven.   Tobacco abuse disorder. Plan: Continue full vent support with ARDS protocol for now.  Once family arrives, will terminally extubate.  Shock, likely due to sedation needs Tachycardia, appears sinus Telemetry Continue low dose levo/neo Will defer CVL based on palliative discussion yesterday  Possible PNA, doubt Plan: D/c all abx today given withdrawal plans.  Widely metastatic cancer, not biopsy proven.  Plan: No interventions given withdrawal plans.  Acute metabolic encephalopathy. Continue nimbex / sedation until family arrives then transition to comfort care with terminal extubation.   FAMILY  - Updates: No family bedside  CC time: 30 min.   Montey Hora, [place] [facility] Medicine Pager: [phone]  or [phone] August 24, 2016, 8:55 AM  Attending Note:  I have examined patient, reviewed labs, studies and notes. I have discussed the case with Junius Roads, and I agree with the data and plans as amended above. 51 year old woman with respiratory failure, bilateral nodular disease presumed to be malignancy. We have discussed her overall prognosis with family in detail. Palliative care has been of  great assistance. Family is ready to proceed with withdrawal of care today. I agree with this plan. I have stopped her paralytics and will work on maximizing her sedating and comfort medications. Wants this has been accomplished we will work to transition her off mechanical ventilation. I reviewed the plans and I rationale with her family at bedside.  Independent critical care time is 31 minutes.   Baltazar Apo, MD, PhD 08-24-2016, 1:45 PM [place] [facility] [phone] or if no answer [phone]

## 2016-08-05 NOTE — Progress Notes (Addendum)
Fentanyl 240 wasted and 50 ml versed wasted and witnessed by Allegra Grana RN

## 2016-08-05 NOTE — Progress Notes (Signed)
[facility] Progress Note Patient Name: Felicia Long DOB: Apr 07, 1966 MRN: [id number]   Date of Service  08/04/2016  HPI/Events of Note  Fever to 102.2 F - Already on scheduled and PRN Tylenol. ALT and AST both normal. Patient is currently on Zosyn.   eICU Interventions  Please give PRN Tylenol dose as ordered.      Intervention Category Major Interventions: Other:  [name] Aug 04, 2016, 12:10 AM

## 2016-08-05 NOTE — Procedures (Signed)
Extubation Procedure Note  Patient Details:   Name: Felicia Long DOB: 1965-07-11 MRN: [id number]   Airway Documentation:     Evaluation  O2 sats: currently acceptable Complications: No apparent complications Patient did tolerate procedure well. Bilateral Breath Sounds: Rhonchi   No   Pt extubated per Withdrawal of Life Protocol per MD order to Room Air. Pt unable to speak or cough post extubation. Rn and family at bedside. RT will continue to monitor.   Jesse Sans 2016/08/20, 2:37 PM

## 2016-08-05 NOTE — Progress Notes (Signed)
Nutrition Brief Note  Chart reviewed. Patient to be terminally extubated when family arrives and transitioned to comfort care.  No further nutrition interventions warranted at this time.  Please re-consult as needed.   Molli Barrows, RD, LDN, [place] Pager [phone] After Hours Pager [phone]

## 2016-08-05 NOTE — Progress Notes (Addendum)
I met with patient's husband Nate and provided support for the compassionate decision to allow for a natural death to occur with comfort and dignity. Nimbex has been stopped- I will assess her level of awareness and discomfort-may be better to use diprivan for terminal wean. She is on fairly high pressor support. If we discontinue pressors she may die prior to extubation. I will discuss next steps with family and primary team. Family gathered at the bedside.  Discussed compassionate wean and answered all of their questions.  Husband requests autopsy for cancer diagnosis/COD.   Time: 35 minutes Greater than 50%  of this time was spent counseling and coordinating care related to the above assessment and plan.  Lane Hacker, DO Palliative Medicine [phone]

## 2016-08-05 DEATH — deceased

## 2016-10-05 NOTE — Discharge Summary (Signed)
PULMONARY / CRITICAL CARE MEDICINE death summary   Name: Felicia Long MRN: [id number] DOB: 02/26/1966    ADMISSION DATE:  07-30-16 CONSULTATION DATE:  2016/07/30   date of death: 08-04-2016  Final cause of death Acute respiratory failure  Secondary causes of death Widespread metastatic disease, suspected to be primary lung cancer but not biopsy proven. ARDS History of tobacco use, possible COPD Shock Sinus tachycardia Acute toxic metabolic encephalopathy  HISTORY OF PRESENT ILLNESS/Hospital course:   Ms. Falzon was a 51 y/o long time smoker (quit about 2 weeks PTA) who was recently evaluated at [facility] for lung nodules found on a CT scan obtained for shortness of breath.  She had undergone a PET CT which showed multiple FDG avid bilateral pulmonary nodules with a large right sided hilar mass.  She was also found to have multiple liver lesions and FDG avid abdominal and thoracic lymph nodes. She came to the [facility] ED with worsening shortness of breath and was found to have O2 saturations in the mid 80s on RA.  A CTA was done to rule out PE which confirmed her multiple pulmonary nodules but did not show PE.  She was wheezing on admission and was given multiple albuterol treatments and 1 hour of continuous albuterol.  Upon admission her O2 saturations improved on 3L [place], however when she arrived on 4E she was noted to have significantly increased WOB, tachypnea and tachycardia.  She was placed on BiPap with improvement in her O2 sats as well as her WOB.  Her ABG on 3L [place] was 7.44/26/71 with a serum CO2 of 20.  Pulmonary was consulted due to her shortness of breath and possible need for a higher level of care.  She quickly progressed to acute respiratory failure requiring intubation and mechanical ventilation.  She developed progressive bilateral infiltrates. She was treated with empiric antibiotics to cover possible pneumonia although her presentation was most consistent with rapidly progressive malignancy.   Unfortunately it was felt that there was no intervention that could be made on her underlying process that would reverse her acute decompensation. Given this discussions were undertaken with her family regarding the direction of her care. The decision was made to transition her to comfort care since it was felt that the source of her respiratory failure was not treatable.  She expired on 08-04-2016.  STUDIES:  CTA - 2016/07/30 > Right hilar mass with innumerable pulmonary nodules, extensive adenopathy. Liver masses. Almost certainly mets.  CULTURES: Blood - 2016-07-30 >  ANTIBIOTICS: Vanc Jul 30, 2016 > Zosyn 30-Jul-2016 >  SIGNIFICANT EVENTS: BiPAP started 07/30/2016  3/18 to ICU for intubation> ARDS protocol  LINES/TUBES: ETT 3/19 >  Baltazar Apo, MD, PhD 09/04/2016, 12:11 PM [place] [facility] [phone] or if no answer [phone]

## 2018-07-09 IMAGING — CR DG CHEST 1V PORT
1 series · 1 of 1 positions shown · non-contrast
Comparison: CT 07/20/2016

CLINICAL DATA: RIGHT pulmonary mass. Multiple pulmonary nodules.
Respiratory failure.

EXAM:
PORTABLE CHEST 1 VIEW

[AP]
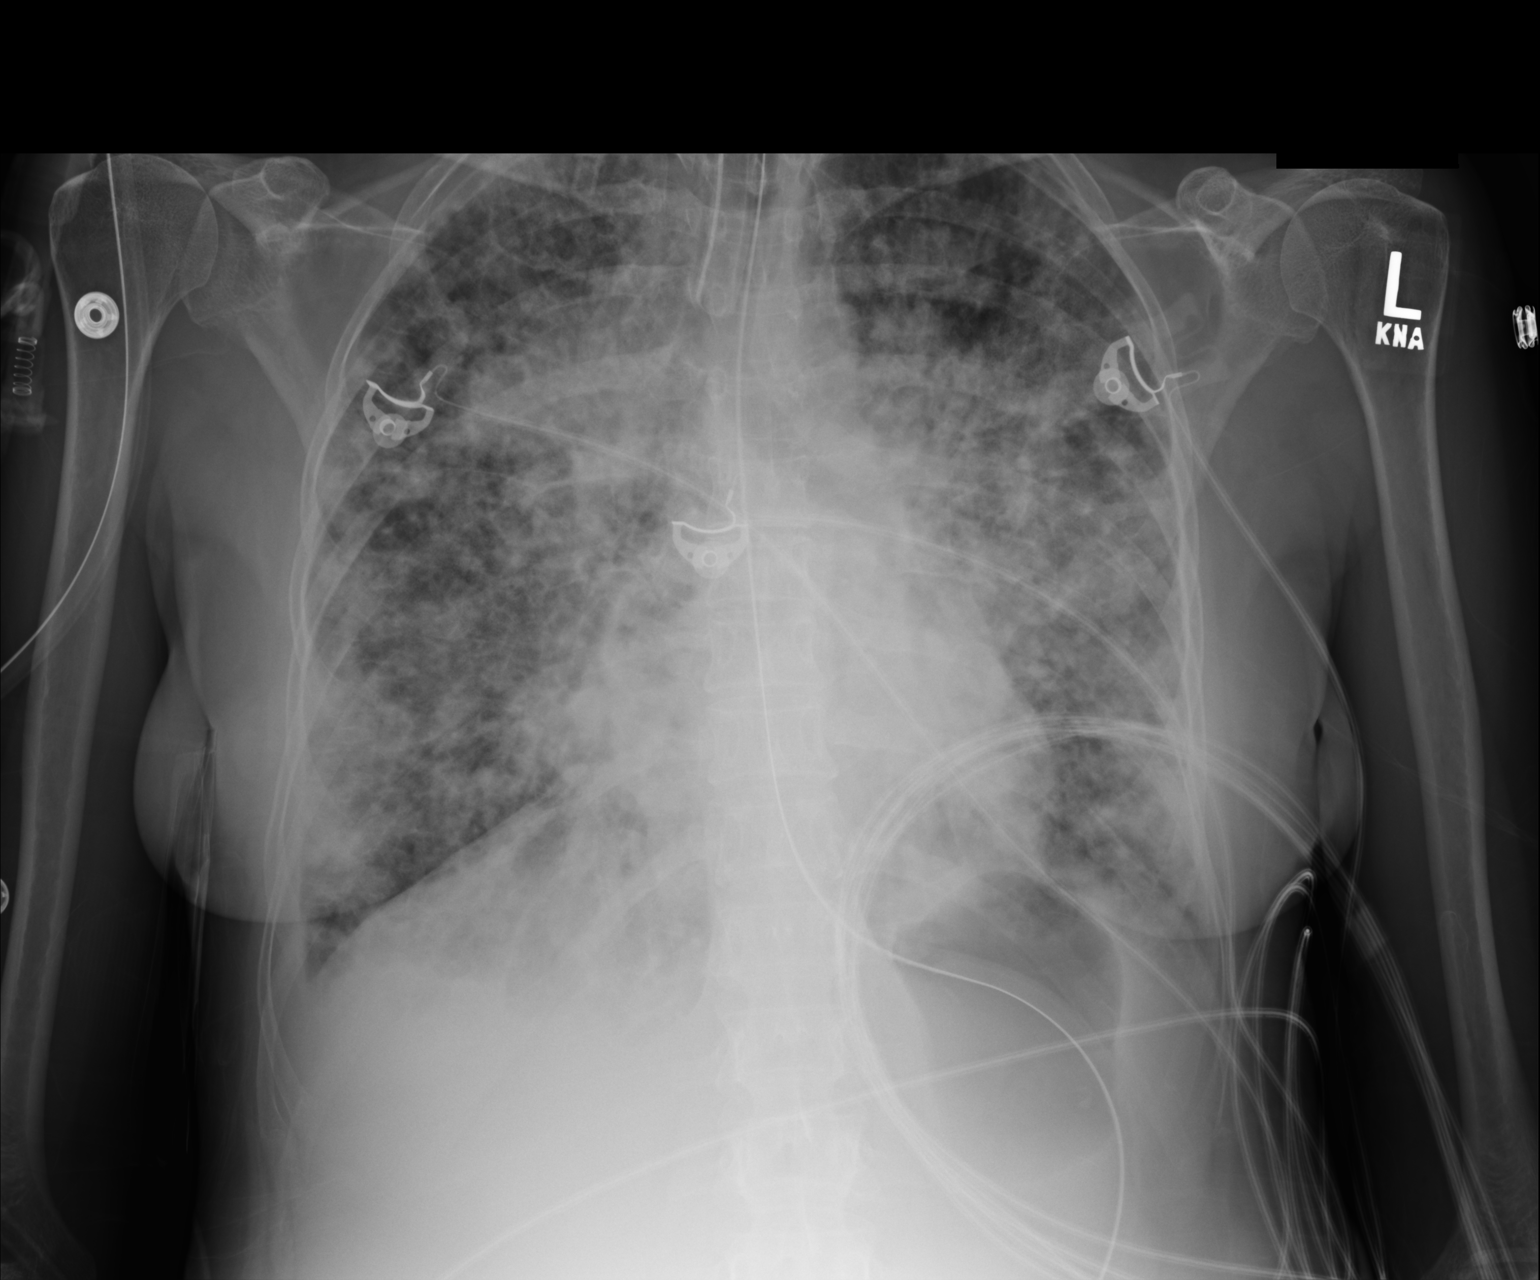

[1 of 1 positions shown; findings below may reference images not displayed]

FINDINGS: Endotracheal tube 2.4 cm from [place]ina.  NG tube in stomach.

RIGHT suprahilar mass again noted.

Innumerable pulmonary nodules again noted.
IMPRESSION: Endotracheal tube in good position.

RIGHT hilar mass and innumerable pulmonary nodules.
